# Patient Record
Sex: Female | Born: 1980 | Race: White | Hispanic: No | Smoking: Current every day smoker
Health system: Southern US, Community
[De-identification: ages and names within clinical notes are randomized; demographics above are authoritative.]

---

## 2007-09-10 ENCOUNTER — Emergency Department: Payer: Self-pay | Admitting: Emergency Medicine

## 2015-05-12 ENCOUNTER — Emergency Department
Admission: EM | Admit: 2015-05-12 | Discharge: 2015-05-12 | Disposition: A | Discharge status: Home / Self Care | Payer: Self-pay | Attending: Emergency Medicine | Admitting: Emergency Medicine

## 2015-05-12 ENCOUNTER — Encounter: Payer: Self-pay | Admitting: Emergency Medicine

## 2015-05-12 DIAGNOSIS — F1123 Opioid dependence with withdrawal: Secondary | ICD-10-CM | POA: Insufficient documentation

## 2015-05-12 DIAGNOSIS — F19230 Other psychoactive substance dependence with withdrawal, uncomplicated: Secondary | ICD-10-CM

## 2015-05-12 DIAGNOSIS — Z79899 Other long term (current) drug therapy: Secondary | ICD-10-CM | POA: Insufficient documentation

## 2015-05-12 DIAGNOSIS — F17210 Nicotine dependence, cigarettes, uncomplicated: Secondary | ICD-10-CM | POA: Insufficient documentation

## 2015-05-12 MED ORDER — CLONIDINE HCL 0.1 MG PO TABS: 0.1000 mg | ORAL_TABLET | Freq: Three times a day (TID) | ORAL | Status: DC | PRN

## 2015-05-12 MED ORDER — CLONIDINE HCL 0.1 MG PO TABS
0.1000 mg | ORAL_TABLET | Freq: Once | ORAL | Status: AC
  Administered 2015-05-12: 0.1 mg via ORAL
  Filled 2015-05-12: qty 1

## 2015-05-12 MED ORDER — ONDANSETRON 4 MG PO TBDP
4.0000 mg | ORAL_TABLET | Freq: Once | ORAL | Status: AC
  Administered 2015-05-12: 4 mg via ORAL
  Filled 2015-05-12: qty 1

## 2015-05-12 MED ORDER — ONDANSETRON HCL 4 MG PO TABS: 4.0000 mg | ORAL_TABLET | Freq: Three times a day (TID) | ORAL | Status: DC | PRN

## 2015-05-12 NOTE — ED Notes (Signed)
Patient c/o nausea, chills, eyes watering, sneezing, legs cramping, and unable to sleep. Patient states that symptoms started the day after her last dose of saboxone. Patient states that her symptoms are some better now but she needs help with the withdrawal symptoms.

## 2015-05-12 NOTE — ED Provider Notes (Signed)
[facility] Emergency Department Provider Note   ____________________________________________  Time seen:  I have reviewed the triage vital signs and the triage nursing note.  HISTORY  Chief Complaint Withdrawal   Historian Patient  HPI Breanna Henry is a 35 y.o. female who has been taking Suboxone chronically and stopped taking it about 7 days ago. She has had nausea, leg cramping, anxiety, trouble sleeping, and diarrhea. She does have a history of depression and is on medication for this now, but now denies any acute worsening depression or any suicidal ideation. She does not have a PCP as she is new to this area. She does not have a psychiatrist as she is new to the area.    History reviewed. No pertinent past medical history. depression and anxiety  There are no active problems to display for this patient.   History reviewed. No pertinent past surgical history.  Current Outpatient Rx  Name  Route  Sig  Dispense  Refill  . buprenorphine-naloxone (SUBOXONE) 8-2 MG SUBL SL tablet   Sublingual   Place 1 tablet under the tongue daily.         Marland Kitchen ibuprofen (ADVIL,MOTRIN) 200 MG tablet   Oral   Take 800 mg by mouth every 8 (eight) hours as needed for mild pain or moderate pain.         . cloNIDine (CATAPRES) 0.1 MG tablet   Oral   Take 1 tablet (0.1 mg total) by mouth 3 (three) times daily as needed (withdrawal symptoms including anxiety).   30 tablet   0   . ondansetron (ZOFRAN) 4 MG tablet   Oral   Take 1 tablet (4 mg total) by mouth every 8 (eight) hours as needed for nausea or vomiting.   30 tablet   0     Allergies Review of patient's allergies indicates no known allergies.  No family history on file.  Social History Social History  Substance Use Topics  . Smoking status: Current Every Day Smoker -- 1.00 packs/day    Types: Cigarettes  . Smokeless tobacco: Never Used  . Alcohol Use: No    Review of Systems  Constitutional:  Negative for fever. Eyes: Negative for visual changes. ENT: Negative for sore throat. Cardiovascular: Negative for chest pain. Respiratory: Negative for shortness of breath. Gastrointestinal: Positive for vomiting and diarrhea. Genitourinary: Negative for dysuria. Musculoskeletal: Negative for back pain. Skin: Negative for rash. Neurological: Occasional headache. 10 point Review of Systems otherwise negative ____________________________________________   PHYSICAL EXAM:  VITAL SIGNS: ED Triage Vitals  Enc Vitals Group     BP 05/12/15 1634 113/58 mmHg     Pulse Rate 05/12/15 1634 56     Resp 05/12/15 1634 18     Temp 05/12/15 1634 98.2 F (36.8 C)     Temp Source 05/12/15 1634 Oral     SpO2 05/12/15 1634 100 %     Weight 05/12/15 1634 130 lb (58.968 kg)     Height 05/12/15 1634  (1.676 m)     Head Cir --      Peak Flow --      Pain Score 05/12/15 1636 8     Pain Loc --      Pain Edu? --      Excl. in GC? --      Constitutional: Alert and oriented. Well appearing and in no distress. HEENT   Head: Normocephalic and atraumatic.      Eyes: Conjunctivae are normal. PERRL. Normal extraocular  movements.      Ears:         Nose: No congestion/rhinnorhea.   Mouth/Throat: Mucous membranes are moist.   Neck: No stridor. Cardiovascular/Chest: Normal rate, regular rhythm.  No murmurs, rubs, or gallops. Respiratory: Normal respiratory effort without tachypnea nor retractions. Breath sounds are clear and equal bilaterally. No wheezes/rales/rhonchi. Gastrointestinal: Soft. No distention, no guarding, no rebound. Nontender.    Genitourinary/rectal:Deferred Musculoskeletal: Nontender with normal range of motion in all extremities. No joint effusions.  No lower extremity tenderness.  No edema. Neurologic:  Normal speech and language. No gross or focal neurologic deficits are appreciated. Skin:  Skin is warm, dry and intact. No rash noted. Psychiatric: Mood and affect are  normal. Speech and behavior are normal. Patient exhibits appropriate insight and judgment.  ____________________________________________   EKG I, Governor Rooks, MD, the attending physician have personally viewed and interpreted all ECGs.  None ____________________________________________  LABS (pertinent positives/negatives)  None  ____________________________________________  RADIOLOGY All Xrays were viewed by me. Imaging interpreted by Radiologist.  None __________________________________________  PROCEDURES  Procedure(s) performed: None  Critical Care performed: None  ____________________________________________   ED COURSE / ASSESSMENT AND PLAN  Pertinent labs & imaging results that were available during my care of the patient were reviewed by me and considered in my medical decision making (see chart for details).   Patient is here with symptoms consistent with Suboxone withdrawal. She feels okay after discussion about treating with Zofran and clonidine and having her follow up at the [place] [facility] and also [facility].  Denies fevers or other illness. Chose to forgo any additional lab testing.    CONSULTATIONS:   None   Patient / Family / Caregiver informed of clinical course, medical decision-making process, and agree with plan.   I discussed return precautions, follow-up instructions, and discharged instructions with patient and/or family.   ___________________________________________   FINAL CLINICAL IMPRESSION(S) / ED DIAGNOSES   Final diagnoses:  Acute narcotic withdrawal, uncomplicated              Note: This dictation was prepared with Dragon dictation. Any transcriptional errors that result from this process are unintentional   Governor Rooks, MD 05/13/15 [phone]

## 2015-05-12 NOTE — ED Notes (Addendum)
Patient states "I have been on saboxone for the past 5 years and am tired of being dependent on the saboxone".  Patient stopped taking saboxone 6 days ago.  Last dose was 05/05/2015.  Patient states when stopped taking saboxone, had been taking 8 mg daily.  Patient had been trying to take self off of saboxone.  Denies SI/ HI.  C/O pain all over. Patient is AAOx3.

## 2015-05-12 NOTE — Discharge Instructions (Signed)
You were evaluated for symptoms of withdrawal from suboxone.  You are given medications for symptoms.  Due to follow-up with mental health provider at [facility], as well as primary care physician at [facility] clinic.  Return to the emergency department for any worsening condition including any chest pain or trouble breathing, confusion or altered mental status, seizure, worsening anxiety or depression or any thoughts of wanting to hurt yourself or others.   Opioid Withdrawal Opioids are a group of narcotic drugs. They include the street drug heroin. They also include pain medicines, such as morphine, hydrocodone, oxycodone, and fentanyl. Opioid withdrawal is a group of characteristic physical and mental signs and symptoms. It typically occurs if you have been using opioids daily for several weeks or longer and stop using or rapidly decrease use. Opioid withdrawal can also occur if you have used opioids daily for a long time and are given a medicine to block the effect.  SIGNS AND SYMPTOMS Opioid withdrawal includes three or more of the following symptoms:   Depressed, anxious, or irritable mood.  Nausea or vomiting.  Muscle aches or spasms.   Watery eyes.   Runny nose.  Dilated pupils, sweating, or hairs standing on end.  Diarrhea or intestinal cramping.  Yawning.   Fever.  Increased blood pressure.  Fast pulse.  Restlessness or trouble sleeping. These signs and symptoms occur within several hours of stopping or reducing short-acting opioids, such as heroin. They can occur within 3 days of stopping or reducing long-acting opioids, such as methadone. Withdrawal begins within minutes of receiving a drug that blocks the effects of opioids, such as naltrexone or naloxone. DIAGNOSIS  Opioid use disorder is diagnosed by your health care provider. You will be asked about your symptoms, drug and alcohol use, medical history, and use of medicines. A physical exam may be done. Lab tests may be  ordered. Your health care provider may have you see a mental health professional.  TREATMENT  The treatment for opioid withdrawal is usually provided by medical doctors with special training in substance use disorders (addiction specialists). The following medicines may be included in treatment:  Opioids given in place of the abused opioid. They turn on opioid receptors in the brain and lessen or prevent withdrawal symptoms. They are gradually decreased (opioid substitution and taper).  Non-opioids that can lessen certain opioid withdrawal symptoms. They may be used alone or with opioid substitution and taper. Successful long-term recovery usually requires medicine, counseling, and group support. HOME CARE INSTRUCTIONS   Take medicines only as directed by your health care provider.  Check with your health care provider before starting new medicines.  Keep all follow-up visits as directed by your health care provider. SEEK MEDICAL CARE IF:  You are not able to take your medicines as directed.  Your symptoms get worse.  You relapse. SEEK IMMEDIATE MEDICAL CARE IF:  You have serious thoughts about hurting yourself or others.  You have a seizure.  You lose consciousness.   This information is not intended to replace advice given to you by your health care provider. Make sure you discuss any questions you have with your health care provider.   Document Released: 03/20/2003 Document Revised: 04/07/2014 Document Reviewed: 03/30/2013 Elsevier Interactive Patient Education [employer]

## 2016-12-06 ENCOUNTER — Emergency Department: Payer: Self-pay

## 2016-12-06 DIAGNOSIS — J209 Acute bronchitis, unspecified: Secondary | ICD-10-CM | POA: Insufficient documentation

## 2016-12-06 DIAGNOSIS — F17210 Nicotine dependence, cigarettes, uncomplicated: Secondary | ICD-10-CM | POA: Insufficient documentation

## 2016-12-06 DIAGNOSIS — Z79899 Other long term (current) drug therapy: Secondary | ICD-10-CM | POA: Insufficient documentation

## 2016-12-06 LAB — CBC
HEMATOCRIT: 39.3 % (ref 35.0–47.0)
HEMOGLOBIN: 13.7 g/dL (ref 12.0–16.0)
MCH: 32.3 pg (ref 26.0–34.0)
MCHC: 35 g/dL (ref 32.0–36.0)
MCV: 92.4 fL (ref 80.0–100.0)
Platelets: 181 10*3/uL (ref 150–440)
RBC: 4.25 MIL/uL (ref 3.80–5.20)
RDW: 13.9 % (ref 11.5–14.5)
WBC: 9.5 10*3/uL (ref 3.6–11.0)

## 2016-12-06 LAB — BASIC METABOLIC PANEL
Anion gap: 8 (ref 5–15)
BUN: 10 mg/dL (ref 6–20)
CHLORIDE: 102 mmol/L (ref 101–111)
CO2: 25 mmol/L (ref 22–32)
Calcium: 9.2 mg/dL (ref 8.9–10.3)
Creatinine, Ser: 0.57 mg/dL (ref 0.44–1.00)
GFR calc Af Amer: >60 mL/min (ref >60)
GFR calc non Af Amer: >60 mL/min (ref >60)
GLUCOSE: 105 mg/dL — AB (ref 65–99)
POTASSIUM: 3.7 mmol/L (ref 3.5–5.1)
Sodium: 135 mmol/L (ref 135–145)

## 2016-12-06 LAB — TROPONIN I: Troponin I: <0.03 ng/mL (ref <0.03)

## 2016-12-06 NOTE — ED Triage Notes (Signed)
Patient reports chest pain across upper chest since yesterday.  Reports increased pain with cough.

## 2016-12-06 NOTE — ED Notes (Signed)
Patient transported to X-ray 

## 2016-12-07 ENCOUNTER — Emergency Department
Admission: EM | Admit: 2016-12-07 | Discharge: 2016-12-07 | Disposition: A | Discharge status: Home / Self Care | Payer: Self-pay | Attending: Emergency Medicine | Admitting: Emergency Medicine

## 2016-12-07 DIAGNOSIS — J209 Acute bronchitis, unspecified: Secondary | ICD-10-CM

## 2016-12-07 MED ORDER — AZITHROMYCIN 250 MG PO TABS: ORAL_TABLET | ORAL | 0 refills | Status: AC

## 2016-12-07 MED ORDER — BENZONATATE 100 MG PO CAPS
100.0000 mg | ORAL_CAPSULE | Freq: Once | ORAL | Status: AC
  Administered 2016-12-07: 100 mg via ORAL
  Filled 2016-12-07: qty 1

## 2016-12-07 MED ORDER — BENZONATATE 100 MG PO CAPS: 100.0000 mg | ORAL_CAPSULE | Freq: Three times a day (TID) | ORAL | 0 refills | Status: AC | PRN

## 2016-12-07 MED ORDER — AZITHROMYCIN 500 MG PO TABS
500.0000 mg | ORAL_TABLET | Freq: Once | ORAL | Status: AC
  Administered 2016-12-07: 500 mg via ORAL
  Filled 2016-12-07: qty 1

## 2016-12-07 MED ORDER — ALBUTEROL SULFATE HFA 108 (90 BASE) MCG/ACT IN AERS: 2.0000 | INHALATION_SPRAY | Freq: Four times a day (QID) | RESPIRATORY_TRACT | 0 refills | Status: DC | PRN

## 2016-12-07 NOTE — ED Provider Notes (Signed)
[facility] Emergency Department Provider Note    First MD Initiated Contact with Patient 12/07/16 [phone]     (approximate)  I have reviewed the triage vital signs and the nursing notes.   HISTORY  Chief Complaint Chest Pain and Cough    HPI Breanna Henry is a 36 y.o. female one-day history of cough congestion and chest discomfort with coughing. Patient denies any fever afebrile on presentation. Patient admits to a half a pack to two thirds of a pack daily cigarette use. Patient denies any known sick contact.   Past medical history None There are no active problems to display for this patient.   Past surgical history None  Prior to Admission medications   Medication Sig Start Date End Date Taking? Authorizing Provider  buprenorphine-naloxone (SUBOXONE) 8-2 MG SUBL SL tablet Place 1 tablet under the tongue daily.    [provider]  cloNIDine (CATAPRES) 0.1 MG tablet Take 1 tablet (0.1 mg total) by mouth 3 (three) times daily as needed (withdrawal symptoms including anxiety). 05/12/15 05/11/16  Governor RooksLord, Rebecca, MD  ibuprofen (ADVIL,MOTRIN) 200 MG tablet Take 800 mg by mouth every 8 (eight) hours as needed for mild pain or moderate pain.    [provider]  ondansetron (ZOFRAN) 4 MG tablet Take 1 tablet (4 mg total) by mouth every 8 (eight) hours as needed for nausea or vomiting. 05/12/15   Governor RooksLord, Rebecca, MD    Allergies No known drug allergies No family history on file.  Social History Social History  Substance Use Topics  . Smoking status: Current Every Day Smoker    Packs/day: 1.00    Types: Cigarettes  . Smokeless tobacco: Never Used  . Alcohol use No    Review of Systems Constitutional: No fever/chills Eyes: No visual changes. ENT: No sore throat. Cardiovascular: positive for chest discomfort  Respiratory: Denies shortness of breath.positive for cough Gastrointestinal: No abdominal pain.  No nausea, no vomiting.  No  diarrhea.  No constipation. Genitourinary: Negative for dysuria. Musculoskeletal: Negative for neck pain.  Negative for back pain. Integumentary: Negative for rash. Neurological: Negative for headaches, focal weakness or numbness.   ____________________________________________   PHYSICAL EXAM:  VITAL SIGNS: ED Triage Vitals  Enc Vitals Group     BP 12/06/16 2313 (!) 114/58     Pulse Rate 12/07/16 0339 76     Resp 12/06/16 2313 20     Temp 12/06/16 2313 98.9 F (37.2 C)     Temp Source 12/06/16 2313 Oral     SpO2 12/06/16 2313 98 %     Weight 12/06/16 2309 63.5 kg (140 lb)     Height 12/06/16 2309 1.676 m (5\' 6" )     Head Circumference --      Peak Flow --      Pain Score 12/06/16 2309 7     Pain Loc --      Pain Edu? --      Excl. in GC? --     Constitutional: Alert and oriented. Well appearing and in no acute distress. Eyes: Conjunctivae are normal.  Head: Atraumatic. Mouth/Throat: Mucous membranes are moist.  Oropharynx non-erythematous. Neck: No stridor.   Cardiovascular: Normal rate, regular rhythm. Good peripheral circulation. Grossly normal heart sounds. Respiratory: Normal respiratory effort.  No retractions. Lungs CTAB. Gastrointestinal: Soft and nontender. No distention.  Musculoskeletal: No lower extremity tenderness nor edema. No gross deformities of extremities. Neurologic:  Normal speech and language. No gross focal neurologic deficits are appreciated.  Skin:  Skin is warm, dry and intact. No rash noted. Psychiatric: Mood and affect are normal. Speech and behavior are normal.  ____________________________________________   LABS (all labs ordered are listed, but only abnormal results are displayed)  Labs Reviewed  BASIC METABOLIC PANEL - Abnormal; Notable for the following:       Result Value   Glucose, Bld 105 (*)    All other components within normal limits  CBC  TROPONIN I   ____________________________________________  EKG  ED ECG  REPORT I, [place] Rania Prothero, the attending physician, personally viewed and interpreted this ECG.   Date: 12/07/2016  EKG Time: 11:08 PM  Rate: 107  Rhythm: sinus tachycardia  Axis: normal  Intervals:normal  ST&T Change: none  ____________________________________________  RADIOLOGY I, [place] Idora Brosious, personally viewed and evaluated these images (plain radiographs) as part of my medical decision making, as well as reviewing the written report by the radiologist.  Dg Chest 2 View  Result Date: 12/07/2016 CLINICAL DATA:  Upper chest pain since yesterday. Increased pain with cough. Current smoker. EXAM: CHEST  2 VIEW COMPARISON:  None. FINDINGS: Mild hyperinflation. Peribronchial thickening suggesting chronic bronchitis. No airspace disease or consolidation in the lungs. No blunting of costophrenic angles. No pneumothorax. Mediastinal contours appear intact. Normal heart size and pulmonary vascularity. IMPRESSION: Chronic bronchitic changes in the lungs. No evidence of active pulmonary disease. Electronically Signed   By: Burman Nieves M.D.   On: 12/07/2016 00:01    ____________________  Procedures   ____________________________________________   INITIAL IMPRESSION / ASSESSMENT AND PLAN / ED COURSE  Pertinent labs & imaging results that were available during my care of the patient were reviewed by me and considered in my medical decision making (see chart for details).  36 year old female presenting with history of physical exam concerning for possible acute bronchitis. Chest x-ray revealed no evidence of pneumonia. Patient given azithromycin and Tessalon the emergency department will be prescribed the same for home as well as an albuterol inhaler      ____________________________________________  FINAL CLINICAL IMPRESSION(S) / ED DIAGNOSES  Final diagnoses:  Acute bronchitis, unspecified organism     MEDICATIONS GIVEN DURING THIS VISIT:  Medications - No data to  display   NEW OUTPATIENT MEDICATIONS STARTED DURING THIS VISIT:  New Prescriptions   No medications on file    Modified Medications   No medications on file    Discontinued Medications   No medications on file     Note:  This document was prepared using Dragon voice recognition software and may include unintentional dictation errors.    Darci Current, MD 12/07/16 [phone]

## 2016-12-07 NOTE — ED Notes (Signed)
ED Provider at bedside. 

## 2016-12-23 ENCOUNTER — Ambulatory Visit: Payer: Self-pay | Admitting: Pharmacy Technician

## 2017-01-06 ENCOUNTER — Ambulatory Visit: Payer: Self-pay | Admitting: Pharmacy Technician

## 2017-01-06 ENCOUNTER — Encounter (INDEPENDENT_AMBULATORY_CARE_PROVIDER_SITE_OTHER): Payer: Self-pay

## 2017-01-06 DIAGNOSIS — Z79899 Other long term (current) drug therapy: Secondary | ICD-10-CM

## 2017-01-06 NOTE — Progress Notes (Signed)
Completed [facility] application and contract.  Patient agreed to all terms of the [facility] contract.  Patient approved to receive medication assistance at [facility] through 2018, as long as eligibility criteria continues to be met.  Provided patient with community resource material based on her particular needs.    [facility]

## 2017-06-07 ENCOUNTER — Telehealth: Payer: Self-pay | Admitting: Pharmacy Technician

## 2017-06-07 NOTE — Telephone Encounter (Signed)
Patient failed to provide 2019 financial documentation.  No additional medication assistance will be provided by [facility] without the required proof of income documentation.  Patient notified by letter.  [employer] J. Devansh Riese Care Manager [facility] 

## 2017-11-18 ENCOUNTER — Emergency Department
Admission: EM | Admit: 2017-11-18 | Discharge: 2017-11-18 | Disposition: A | Discharge status: Home / Self Care | Payer: Self-pay | Attending: Emergency Medicine | Admitting: Emergency Medicine

## 2017-11-18 ENCOUNTER — Other Ambulatory Visit: Payer: Self-pay

## 2017-11-18 ENCOUNTER — Emergency Department: Payer: Self-pay

## 2017-11-18 DIAGNOSIS — R51 Headache: Secondary | ICD-10-CM | POA: Insufficient documentation

## 2017-11-18 DIAGNOSIS — Y929 Unspecified place or not applicable: Secondary | ICD-10-CM | POA: Insufficient documentation

## 2017-11-18 DIAGNOSIS — R6884 Jaw pain: Secondary | ICD-10-CM | POA: Insufficient documentation

## 2017-11-18 DIAGNOSIS — Y998 Other external cause status: Secondary | ICD-10-CM | POA: Insufficient documentation

## 2017-11-18 DIAGNOSIS — F17210 Nicotine dependence, cigarettes, uncomplicated: Secondary | ICD-10-CM | POA: Insufficient documentation

## 2017-11-18 DIAGNOSIS — M25511 Pain in right shoulder: Secondary | ICD-10-CM | POA: Insufficient documentation

## 2017-11-18 DIAGNOSIS — Y9389 Activity, other specified: Secondary | ICD-10-CM | POA: Insufficient documentation

## 2017-11-18 DIAGNOSIS — T07XXXA Unspecified multiple injuries, initial encounter: Secondary | ICD-10-CM | POA: Insufficient documentation

## 2017-11-18 DIAGNOSIS — S80212A Abrasion, left knee, initial encounter: Secondary | ICD-10-CM | POA: Insufficient documentation

## 2017-11-18 DIAGNOSIS — S0511XA Contusion of eyeball and orbital tissues, right eye, initial encounter: Secondary | ICD-10-CM | POA: Insufficient documentation

## 2017-11-18 DIAGNOSIS — H9202 Otalgia, left ear: Secondary | ICD-10-CM | POA: Insufficient documentation

## 2017-11-18 MED ORDER — ACETAMINOPHEN 500 MG PO TABS
1000.0000 mg | ORAL_TABLET | Freq: Once | ORAL | Status: AC
Start: 2017-11-18 — End: 2017-11-18
  Administered 2017-11-18: 1000 mg via ORAL
  Filled 2017-11-18: qty 2
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
## 2017-11-18 NOTE — Discharge Instructions (Addendum)
Continue to take Tylenol or Motrin for your pain.  Please apply an ice pack to your face for 10 to 15 minutes every 2 hours to decrease swelling and pain.  Return to the emergency department if you feel unsafe, if you develop severe pain or vomiting, for visual changes, or for any other symptoms concerning to you.

## 2017-11-18 NOTE — ED Notes (Addendum)
Visual acuity preformed with corrective lenses. L eye 20/40, R eye 20/50 and bilateral 20/40. Pt c/o everything being blurry in R eye.

## 2018-03-01 ENCOUNTER — Emergency Department
Admission: EM | Admit: 2018-03-01 | Discharge: 2018-03-01 | Disposition: A | Discharge status: Home / Self Care | Payer: Self-pay | Attending: Emergency Medicine | Admitting: Emergency Medicine

## 2018-03-01 ENCOUNTER — Encounter: Payer: Self-pay | Admitting: Emergency Medicine

## 2018-03-01 ENCOUNTER — Emergency Department: Payer: Self-pay

## 2018-03-01 ENCOUNTER — Other Ambulatory Visit: Payer: Self-pay

## 2018-03-01 DIAGNOSIS — R0602 Shortness of breath: Secondary | ICD-10-CM | POA: Insufficient documentation

## 2018-03-01 DIAGNOSIS — F1210 Cannabis abuse, uncomplicated: Secondary | ICD-10-CM | POA: Insufficient documentation

## 2018-03-01 DIAGNOSIS — R202 Paresthesia of skin: Secondary | ICD-10-CM | POA: Insufficient documentation

## 2018-03-01 DIAGNOSIS — F17210 Nicotine dependence, cigarettes, uncomplicated: Secondary | ICD-10-CM | POA: Insufficient documentation

## 2018-03-01 DIAGNOSIS — J9801 Acute bronchospasm: Secondary | ICD-10-CM | POA: Insufficient documentation

## 2018-03-01 LAB — CBC
HCT: 40.6 % (ref 36.0–46.0)
Hemoglobin: 13.6 g/dL (ref 12.0–15.0)
MCH: 31.8 pg (ref 26.0–34.0)
MCHC: 33.5 g/dL (ref 30.0–36.0)
MCV: 94.9 fL (ref 80.0–100.0)
Platelets: 217 10*3/uL (ref 150–400)
RBC: 4.28 MIL/uL (ref 3.87–5.11)
RDW: 12.7 % (ref 11.5–15.5)
WBC: 5.7 10*3/uL (ref 4.0–10.5)
nRBC: 0 % (ref 0.0–0.2)

## 2018-03-01 LAB — BASIC METABOLIC PANEL
Anion gap: 5 (ref 5–15)
BUN: 10 mg/dL (ref 6–20)
CO2: 26 mmol/L (ref 22–32)
Calcium: 8.8 mg/dL — ABNORMAL LOW (ref 8.9–10.3)
Chloride: 108 mmol/L (ref 98–111)
Creatinine, Ser: 0.58 mg/dL (ref 0.44–1.00)
GFR calc Af Amer: >60 mL/min (ref >60)
GLUCOSE: 117 mg/dL — AB (ref 70–99)
Potassium: 4.1 mmol/L (ref 3.5–5.1)
Sodium: 139 mmol/L (ref 135–145)

## 2018-03-01 LAB — TROPONIN I: Troponin I: <0.03 ng/mL (ref <0.03)

## 2018-03-01 MED ORDER — IPRATROPIUM-ALBUTEROL 0.5-2.5 (3) MG/3ML IN SOLN
3.0000 mL | Freq: Once | RESPIRATORY_TRACT | Status: AC
  Administered 2018-03-01: 3 mL via RESPIRATORY_TRACT
  Filled 2018-03-01: qty 3

## 2018-03-01 MED ORDER — ALBUTEROL SULFATE HFA 108 (90 BASE) MCG/ACT IN AERS: 2.0000 | INHALATION_SPRAY | Freq: Four times a day (QID) | RESPIRATORY_TRACT | 0 refills | Status: DC | PRN

## 2018-03-01 NOTE — ED Triage Notes (Signed)
Pt presents with sob and tingling in bilateral fingertips intermittently since last night. Pt denies hx of asthma or breathing difficulty. Pt alert & oriented, speaking in complete sentences. NAD noted.

## 2018-03-01 NOTE — ED Provider Notes (Signed)
[facility] Emergency Department Provider Note   ____________________________________________    I have reviewed the triage vital signs and the nursing notes.   HISTORY  Chief Complaint Shortness of Breath     HPI Breanna Henry is a 37 y.o. female who presents with complaints of shortness of breath yesterday.  Patient reports approximately 10 PM she felt mildly short of breath and her fingertips were tingling on both hands.  This morning she reports feeling better, her breathing is "almost normal".  She denies fevers or chills but is concerned because she had a pneumonia a year ago.  She does smoke cigarettes daily, half a pack.  She denies pleurisy, no recent travel.  No calf pain or swelling.   History reviewed. No pertinent past medical history.  There are no active problems to display for this patient.   History reviewed. No pertinent surgical history.  Prior to Admission medications   Medication Sig Start Date End Date Taking? Authorizing Provider  albuterol (PROVENTIL HFA;VENTOLIN HFA) 108 (90 Base) MCG/ACT inhaler Inhale 2 puffs into the lungs every 6 (six) hours as needed for wheezing or shortness of breath. 03/01/18   Jene Every, MD  buprenorphine-naloxone (SUBOXONE) 8-2 MG SUBL SL tablet Place 1 tablet under the tongue daily.    [provider]  cloNIDine (CATAPRES) 0.1 MG tablet Take 1 tablet (0.1 mg total) by mouth 3 (three) times daily as needed (withdrawal symptoms including anxiety). 05/12/15 05/11/16  Governor Rooks, MD  ibuprofen (ADVIL,MOTRIN) 200 MG tablet Take 800 mg by mouth every 8 (eight) hours as needed for mild pain or moderate pain.    [provider]  ondansetron (ZOFRAN) 4 MG tablet Take 1 tablet (4 mg total) by mouth every 8 (eight) hours as needed for nausea or vomiting. 05/12/15   Governor Rooks, MD     Allergies Patient has no known allergies.  History reviewed. No pertinent family history.  Social  History Social History   Tobacco Use  . Smoking status: Current Every Day Smoker    Packs/day: 0.50    Types: Cigarettes  . Smokeless tobacco: Never Used  Substance Use Topics  . Alcohol use: No  . Drug use: Yes    Types: Marijuana    Comment: occasional    Review of Systems  Constitutional: No fever/chills Eyes: No visual changes.  ENT: No throat swelling Cardiovascular: Denies chest pain. Respiratory: As above Gastrointestinal: No abdominal pain.  No nausea, no vomiting.   Genitourinary: Negative for dysuria. Musculoskeletal: Negative for back pain. Skin: Negative for rash. Neurological: Negative for headaches tingling as above   ____________________________________________   PHYSICAL EXAM:  VITAL SIGNS: ED Triage Vitals  Enc Vitals Group     BP --      Pulse Rate 03/01/18 1118 82     Resp 03/01/18 1118 18     Temp 03/01/18 1118 97.6 F (36.4 C)     Temp Source 03/01/18 1118 Oral     SpO2 03/01/18 1118 100 %     Weight 03/01/18 1121 61.2 kg (135 lb)     Height 03/01/18 1121 1.676 m (5\' 6" )     Head Circumference --      Peak Flow --      Pain Score 03/01/18 1119 7     Pain Loc --      Pain Edu? --      Excl. in GC? --     Constitutional: Alert and oriented.  Eyes: Conjunctivae  are normal.   Nose: No congestion/rhinnorhea. Mouth/Throat: Mucous membranes are moist.    Cardiovascular: Normal rate, regular rhythm. Grossly normal heart sounds.  Good peripheral circulation. Respiratory: Normal respiratory effort.  No retractions.  Scattered mild wheezes Gastrointestinal: Soft and nontender. No distention.   Musculoskeletal: No lower extremity tenderness nor edema.  Warm and well perfused Neurologic:  Normal speech and language. No gross focal neurologic deficits are appreciated.  Skin:  Skin is warm, dry and intact. No rash noted. Psychiatric: Mood and affect are normal. Speech and behavior are normal.  ____________________________________________     LABS (all labs ordered are listed, but only abnormal results are displayed)  Labs Reviewed  BASIC METABOLIC PANEL - Abnormal; Notable for the following components:      Result Value   Glucose, Bld 117 (*)    Calcium 8.8 (*)    All other components within normal limits  CBC  TROPONIN I  POC URINE PREG, ED   ____________________________________________  EKG  ED ECG REPORT I, Jene Everyobert Brexley Cutshaw, the attending physician, personally viewed and interpreted this ECG.  Date: 03/01/2018 * Rhythm: normal sinus rhythm QRS Axis: normal Intervals: normal ST/T Wave abnormalities: normal Narrative Interpretation: no evidence of acute ischemia  ____________________________________________  RADIOLOGY  Chest x-ray normal ____________________________________________   PROCEDURES  Procedure(s) performed: No  Procedures   Critical Care performed: No ____________________________________________   INITIAL IMPRESSION / ASSESSMENT AND PLAN / ED COURSE  Pertinent labs & imaging results that were available during my care of the patient were reviewed by me and considered in my medical decision making (see chart for details).  She well-appearing in no acute distress.  Oxygen saturations are normal here, respiratory rate is normal.  Lung exam significant for very mild scattered wheezing, will give DuoNeb.  Suspect bronchospasm related to cigarette use.  Panic attack is another possibility although less likely.  I discussed smoking cessation with the patient for greater than 3 minutes  She felt significant better after DuoNeb treatment.  Will discharge with albuterol inhaler, outpatient follow-up recommended.     ____________________________________________   FINAL CLINICAL IMPRESSION(S) / ED DIAGNOSES  Final diagnoses:  SOB (shortness of breath)  Bronchospasm        Note:  This document was prepared using Dragon voice recognition software and may include unintentional  dictation errors.    Jene EveryKinner, Marshay Slates, MD 03/01/18 1357

## 2019-02-08 ENCOUNTER — Encounter: Payer: Self-pay | Admitting: Emergency Medicine

## 2019-02-08 ENCOUNTER — Other Ambulatory Visit: Payer: Self-pay

## 2019-02-08 ENCOUNTER — Emergency Department
Admission: EM | Admit: 2019-02-08 | Discharge: 2019-02-08 | Disposition: A | Discharge status: Home / Self Care | Payer: Self-pay | Attending: Emergency Medicine | Admitting: Emergency Medicine

## 2019-02-08 DIAGNOSIS — F17210 Nicotine dependence, cigarettes, uncomplicated: Secondary | ICD-10-CM | POA: Insufficient documentation

## 2019-02-08 DIAGNOSIS — Z79899 Other long term (current) drug therapy: Secondary | ICD-10-CM | POA: Insufficient documentation

## 2019-02-08 DIAGNOSIS — K0889 Other specified disorders of teeth and supporting structures: Secondary | ICD-10-CM | POA: Insufficient documentation

## 2019-02-08 MED ORDER — AMOXICILLIN 875 MG PO TABS: 875.0000 mg | ORAL_TABLET | Freq: Two times a day (BID) | ORAL | 0 refills | Status: AC

## 2019-02-08 MED ORDER — KETOROLAC TROMETHAMINE 10 MG PO TABS: 10.0000 mg | ORAL_TABLET | Freq: Four times a day (QID) | ORAL | 0 refills | Status: AC | PRN

## 2019-02-08 MED ORDER — KETOROLAC TROMETHAMINE 30 MG/ML IJ SOLN
30.0000 mg | Freq: Once | INTRAMUSCULAR | Status: AC
  Administered 2019-02-08: 18:00:00 30 mg via INTRAMUSCULAR
  Filled 2019-02-08: qty 1

## 2019-02-08 NOTE — ED Provider Notes (Signed)
[facility] Emergency Department Provider Note  ____________________________________________  Time seen: Approximately 6:13 PM  I have reviewed the triage vital signs and the nursing notes.   HISTORY  Chief Complaint Dental Pain    HPI Breanna Henry is a 38 y.o. female presents to the emergency department with left-sided dental pain.  Patient has few teeth remaining of the left upper jaw and superior 11 is affected by dental caries.  Patient has noticed some swelling along the left upper face that is worsened over the past 2 days and patient states that she cannot chew on the affected side.  She denies fever and chills.  She does not currently have an appointment with a local dentist as she states that she does not have dental insurance.  She denies pain underneath the tongue or difficulty swallowing.  No other alleviating measures have been attempted.        History reviewed. No pertinent past medical history.  There are no active problems to display for this patient.   History reviewed. No pertinent surgical history.  Prior to Admission medications   Medication Sig Start Date End Date Taking? Authorizing Provider  albuterol (PROVENTIL HFA;VENTOLIN HFA) 108 (90 Base) MCG/ACT inhaler Inhale 2 puffs into the lungs every 6 (six) hours as needed for wheezing or shortness of breath. 03/01/18   Jene Every, MD  amoxicillin (AMOXIL) 875 MG tablet Take 1 tablet (875 mg total) by mouth 2 (two) times daily for 10 days. 02/08/19 02/18/19  Orvil Feil, PA-C  buprenorphine-naloxone (SUBOXONE) 8-2 MG SUBL SL tablet Place 1 tablet under the tongue daily.    [provider]  cloNIDine (CATAPRES) 0.1 MG tablet Take 1 tablet (0.1 mg total) by mouth 3 (three) times daily as needed (withdrawal symptoms including anxiety). 05/12/15 05/11/16  Governor Rooks, MD  ibuprofen (ADVIL,MOTRIN) 200 MG tablet Take 800 mg by mouth every 8 (eight) hours as needed for mild pain or  moderate pain.    [provider]  ondansetron (ZOFRAN) 4 MG tablet Take 1 tablet (4 mg total) by mouth every 8 (eight) hours as needed for nausea or vomiting. 05/12/15   Governor Rooks, MD    Allergies Patient has no known allergies.  No family history on file.  Social History Social History   Tobacco Use  . Smoking status: Current Every Day Smoker    Packs/day: 0.50    Types: Cigarettes  . Smokeless tobacco: Never Used  Substance Use Topics  . Alcohol use: No  . Drug use: Yes    Types: Marijuana    Comment: occasional     Review of Systems  Constitutional: No fever/chills Eyes: No visual changes. No discharge ENT: Patient has dental pain.  Cardiovascular: no chest pain. Respiratory: no cough. No SOB. Gastrointestinal: No abdominal pain.  No nausea, no vomiting.  No diarrhea.  No constipation. Genitourinary: Negative for dysuria. No hematuria Musculoskeletal: Negative for musculoskeletal pain. Skin: Negative for rash, abrasions, lacerations, ecchymosis. Neurological: Negative for headaches, focal weakness or numbness.   ____________________________________________   PHYSICAL EXAM:  VITAL SIGNS: ED Triage Vitals  Enc Vitals Group     BP 02/08/19 1707 (!) 105/58     Pulse Rate 02/08/19 1707 92     Resp 02/08/19 1707 16     Temp 02/08/19 1707 98.5 F (36.9 C)     Temp Source 02/08/19 1707 Oral     SpO2 02/08/19 1707 99 %     Weight 02/08/19 1701 134 lb 14.7  oz (61.2 kg)     Height --      Head Circumference --      Peak Flow --      Pain Score 02/08/19 1701 9     Pain Loc --      Pain Edu? --      Excl. in [place]? --      Constitutional: Alert and oriented. Well appearing and in no acute distress. Eyes: Conjunctivae are normal. PERRL. EOMI. Head: Atraumatic. ENT:      Ears:       Nose: No congestion/rhinnorhea.      Mouth/Throat: Mucous membranes are moist.  Patient has left upper jaw swelling.  No pain to palpation underneath the tongue.   Superior 11 is affected by dental caries. Neck: No stridor.  No cervical spine tenderness to palpation. Cardiovascular: Normal rate, regular rhythm. Normal S1 and S2.  Good peripheral circulation. Respiratory: Normal respiratory effort without tachypnea or retractions. Lungs CTAB. Good air entry to the bases with no decreased or absent breath sounds.  Skin:  Skin is warm, dry and intact. No rash noted. Psychiatric: Mood and affect are normal. Speech and behavior are normal. Patient exhibits appropriate insight and judgement.   ____________________________________________   LABS (all labs ordered are listed, but only abnormal results are displayed)  Labs Reviewed - No data to display ____________________________________________  EKG   ____________________________________________  RADIOLOGY   No results found.  ____________________________________________    PROCEDURES  Procedure(s) performed:    Procedures    Medications  ketorolac (TORADOL) 30 MG/ML injection 30 mg (has no administration in time range)     ____________________________________________   INITIAL IMPRESSION / ASSESSMENT AND PLAN / ED COURSE  Pertinent labs & imaging results that were available during my care of the patient were reviewed by me and considered in my medical decision making (see chart for details).  Review of the [place] CSRS was performed in accordance of the [place] prior to dispensing any controlled drugs.         Assessment and plan Dental pain 38 year old female presents to the emergency department with dental pain of superior 11 and swelling of the left upper jaw.  Vital signs were reassuring at triage.  Patient had some mild left upper jaw swelling without gingival induration or fluctuance that would suggest a drainable fluid collection.  Patient had no pain underneath the tongue.  Patient was started on amoxicillin and advised to follow-up with local dentist.  She was given an  injection of Toradol in the emergency department and discharged with oral Toradol.    ____________________________________________  FINAL CLINICAL IMPRESSION(S) / ED DIAGNOSES  Final diagnoses:  Pain, dental      NEW MEDICATIONS STARTED DURING THIS VISIT:  ED Discharge Orders         Ordered    amoxicillin (AMOXIL) 875 MG tablet  2 times daily     02/08/19 1810              This chart was dictated using voice recognition software/Dragon. Despite best efforts to proofread, errors can occur which can change the meaning. Any change was purely unintentional.    Lannie Fields, PA-C 02/08/19 1818    Harvest Dark, MD 02/08/19 2119

## 2019-02-08 NOTE — Discharge Instructions (Signed)
OPTIONS FOR DENTAL FOLLOW UP CARE ° °[place] Department of Health and Human Services - Local Safety Net Dental Clinics °http://www.ncdhhs.gov/dph/oralhealth/services/safetynetclinics.htm °  °[facility] (336-562-3123) ° °[facility] [place] (919-933-9087) ° °[facility] ([phone] ext [phone]) ° °[facility] ([phone]) ° °[facility] ([phone]) °This clinic caters to the indigent population and is on a lottery system. °Location: °[facility], [address], [place] °Clinic Hours: °Wednesdays from 6pm - 9pm, patients seen by a lottery system. °For dates, call or go to [email or web address] °Services: °Cleanings, fillings and simple extractions. °Payment Options: °DENTAL WORK IS FREE OF CHARGE. Bring proof of income or support. °Best way to get seen: °Arrive at 5:15 pm - this is a lottery, NOT first come/first serve, so arriving earlier will not increase your chances of being seen. °  °  °[facility] °[phone] °Select option 1 for emergencies °  °Location: °[facility], [address], [place] °Clinic Hours: °No walk-ins accepted - call the day before to schedule an appointment. °Check in times are 9:30 am and 1:30 pm. °Services: °Simple extractions, temporary fillings, pulpectomy/pulp debridement, uncomplicated abscess drainage. °Payment Options: °PAYMENT IS DUE AT THE TIME OF SERVICE.  Fee is usually $100-200, additional surgical procedures (e.g. abscess drainage) may be extra. °Cash, checks, Visa/MasterCard accepted.  Can file Medicaid if patient is covered for dental - patient should call case worker to check. °No discount for [facility] patients. °Best way to get seen: °MUST call the day before and get onto the schedule. Can usually be seen the next 1-2 days. No walk-ins accepted. °  °  °[facility] °[phone] °   °Location: °[facility], [address], [place] °Clinic Hours: °M, W, Th, F 8am or 1:30pm, Tues 9a or 1:30 - first come/first served. °Services: °Simple extractions, temporary fillings, uncomplicated abscess drainage.  You do not need to be an [place] resident. °Payment Options: °PAYMENT IS DUE AT THE TIME OF SERVICE. °Dental insurance, otherwise sliding scale - bring proof of income or support. °Depending on income and treatment needed, cost is usually $50-200. °Best way to get seen: °Arrive early as it is first come/first served. °  °  °[facility] °[phone] °  °Location: °[address] °Clinic Hours: °Mon-Thu 8a-5p °Services: °Most basic dental services including extractions and fillings. °Payment Options: °PAYMENT IS DUE AT THE TIME OF SERVICE. °Sliding scale, up to 50% off - bring proof if income or support. °Medicaid with dental option accepted. °Best way to get seen: °Call to schedule an appointment, can usually be seen within 2 weeks OR they will try to see walk-ins - show up at 8a or 2p (you may have to wait). °  °  °[facility] °[phone] °[place] RESIDENTS ONLY °  °Location: °[facility], [address], [place], [place] [address] °Clinic Hours: By appointment only. °Monday - Thursday 8am-5pm, Friday 8am-12pm °Services: Cleanings, fillings, extractions. °Payment Options: °PAYMENT IS DUE AT THE TIME OF SERVICE. °Cash, Visa or MasterCard. Sliding scale - $30 minimum per service. °Best way to get seen: °Come in to office, complete packet and make an appointment - need proof of income °or support monies for each household member and proof of [place] residence. °Usually takes about a month to get in. °  °  °[facility] °[phone] °  °Location: °[address]   [place] °Clinic Hours: Walk-in Urgent Care Dental Services are offered Monday-Friday  mornings only. °The numbers of emergencies accepted daily is limited to the number of °providers available. °Maximum 15 - Mondays, Wednesdays & Thursdays °Maximum 10 - Tuesdays & Fridays °Services: °You do not need to be a [place] resident to be seen for a dental emergency. °Emergencies are defined as pain, swelling, abnormal bleeding, or dental trauma. Walkins will receive x-rays if needed. °NOTE: Dental cleaning is not an emergency. °Payment Options: °PAYMENT IS DUE AT THE TIME OF SERVICE. °Minimum co-pay is $40.00 for uninsured patients. °Minimum co-pay is $3.00 for Medicaid with dental coverage. °Dental Insurance is accepted and must be presented at time of visit. °Medicare does not cover dental. °Forms of payment: Cash, credit card, checks. °Best way to get seen: °If not previously registered with the clinic, walk-in dental registration begins at 7:15 am and is on a first come/first serve basis. °If previously registered with the clinic, call to make an appointment. °  °  °[facility] °[phone] °[place] RESIDENTS ONLY °  °Location: °[address], [place], [place] °Clinic Hours: °Mon-Thu 10a-2p °Services: Extractions only! °Payment Options: °FREE (donations accepted) - bring proof of income or support °Best way to get seen: °Call and schedule an appointment OR come at 8am on the 1st Monday of every month (except for holidays) when it is first come/first served. °  °  °[facility] °[phone] °  °Location: °[address], [place] °Clinic Hours: °Friday mornings °Services, Payment Options, Best way to get seen: °Call for info °

## 2019-02-08 NOTE — ED Triage Notes (Signed)
C/O left upper jaw pain and swelling.

## 2019-02-08 NOTE — ED Notes (Signed)
Pt reports needs a lot of dental work and is planning to get some dentures but has pain and swelling around a tooth on the left upper jaw. Pt reports pain for the last 4 days but the last 24 hours has been worse. Pt with swelling noted to the left side of her face. Denies breathing difficulties.

## 2019-05-05 ENCOUNTER — Ambulatory Visit: Payer: Self-pay | Attending: Internal Medicine

## 2019-05-05 DIAGNOSIS — Z20822 Contact with and (suspected) exposure to covid-19: Secondary | ICD-10-CM | POA: Insufficient documentation

## 2019-05-06 LAB — NOVEL CORONAVIRUS, NAA: SARS-CoV-2, NAA: NOT DETECTED
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
## 2020-01-24 IMAGING — CR DG CHEST 2V
1 series · 2 of 2 positions shown · non-contrast
Comparison: 12/06/2016

CLINICAL DATA: Chest tightness which shortness-of-breath since last
night.

EXAM:
CHEST - 2 VIEW

[Series 1: dg chest 2 view · 0.14mm/px · 2 of 2 slices shown]
[im 1/2]
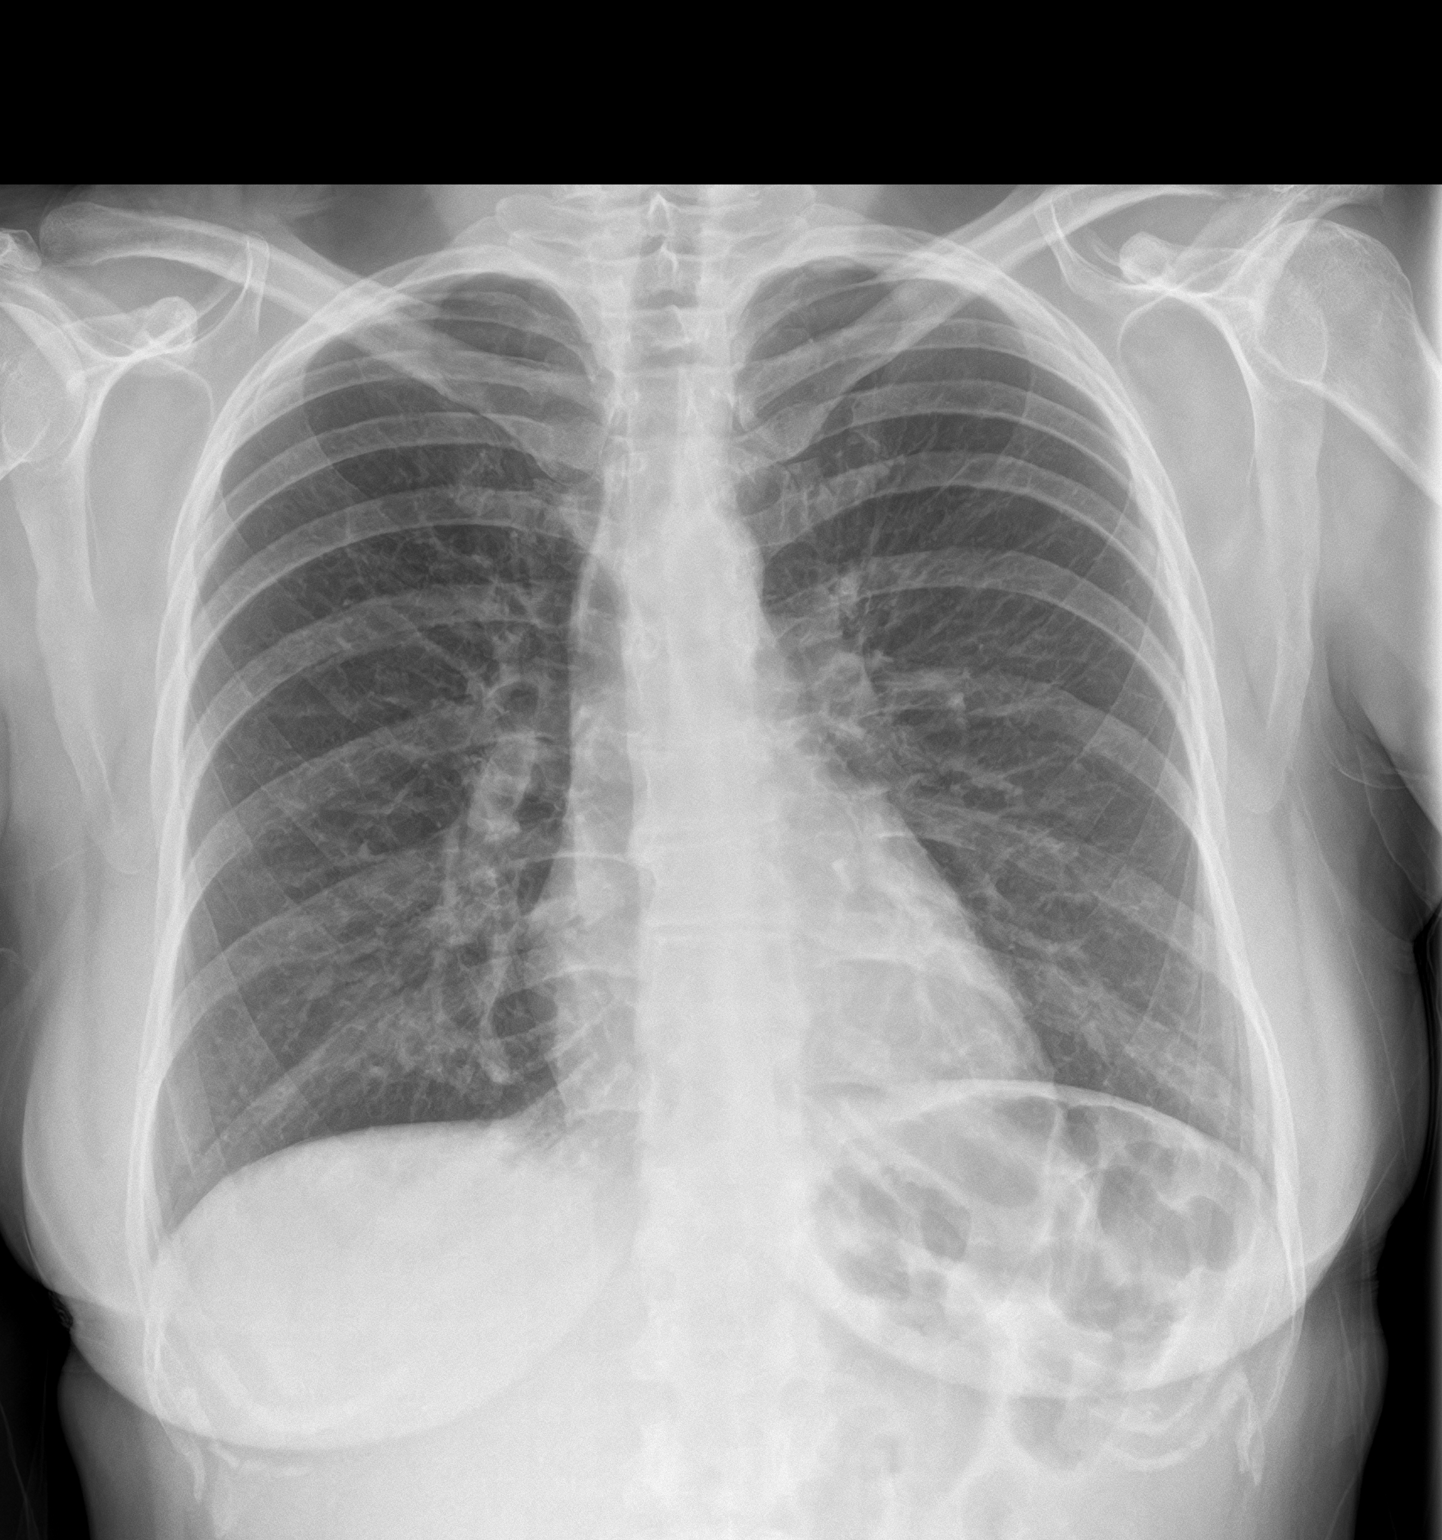
[im 2/2]
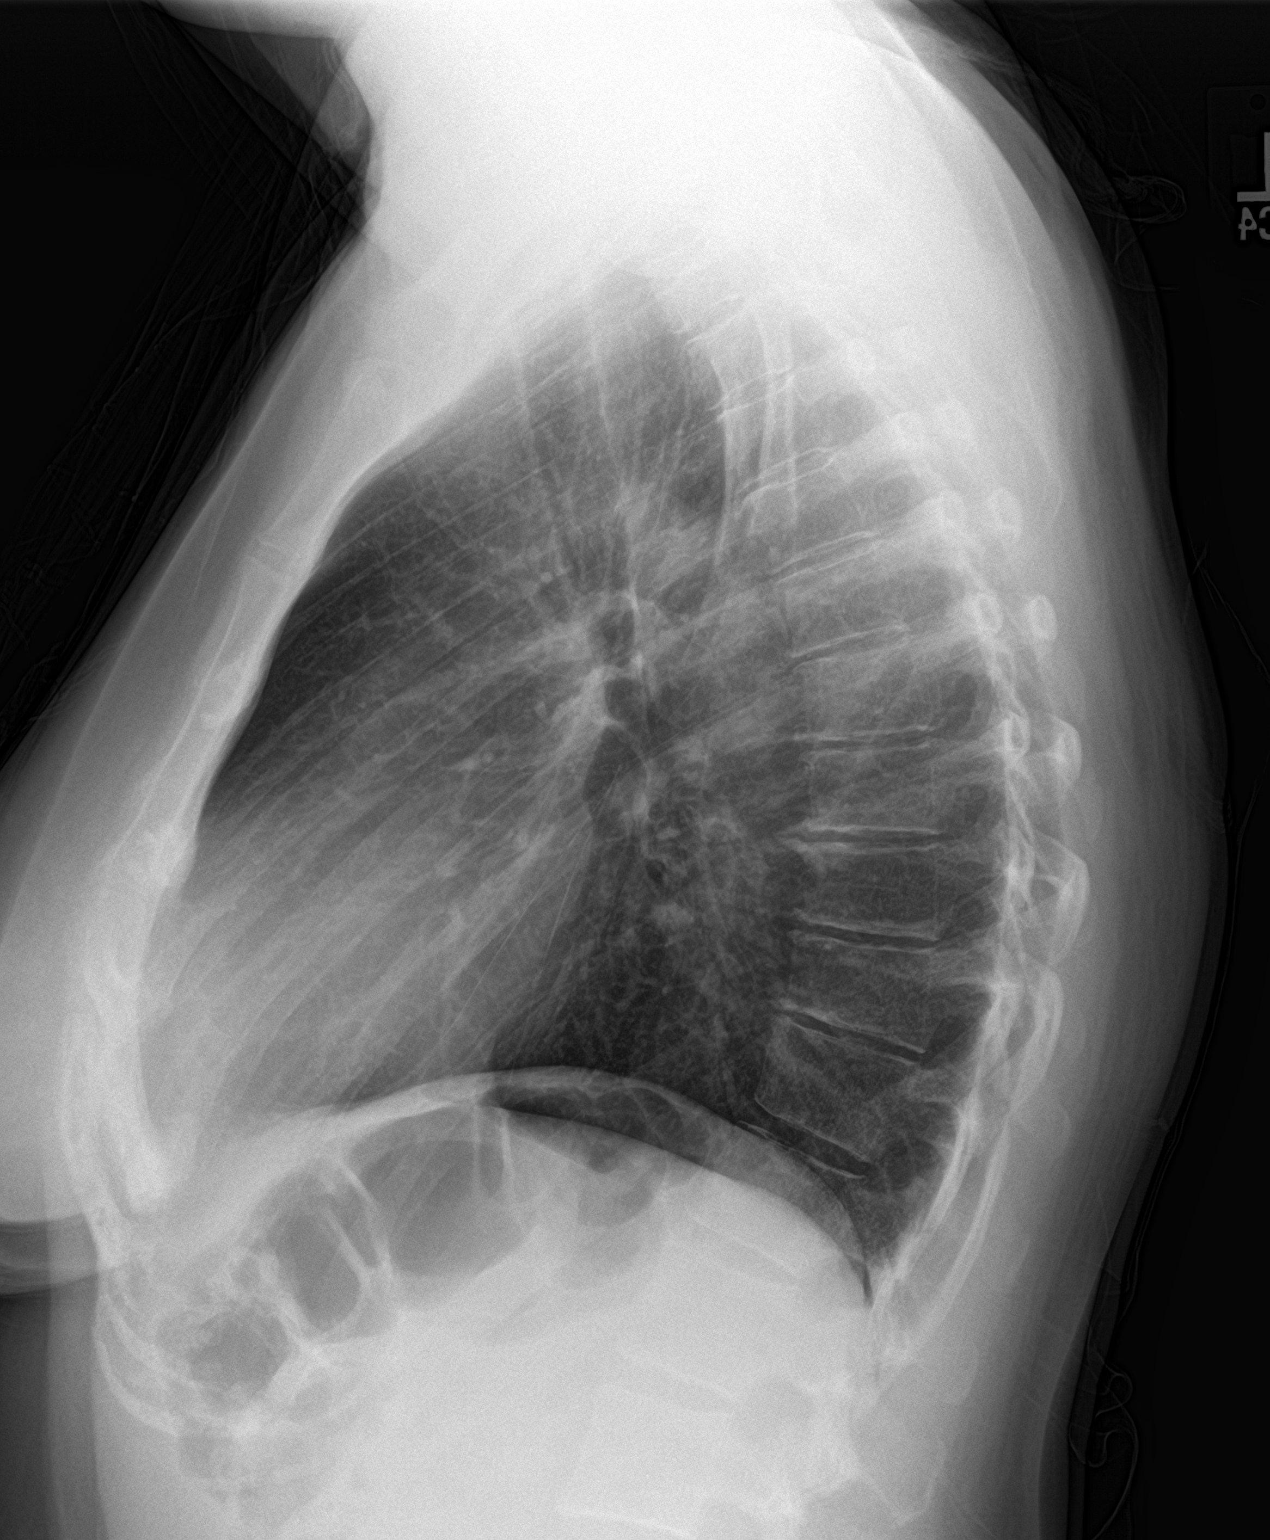

[2 of 2 positions shown; findings below may reference images not displayed]

FINDINGS: Lungs are adequately inflated without focal airspace consolidation
or effusion. Cardiomediastinal silhouette and remainder of the exam
is unchanged.
IMPRESSION: No active cardiopulmonary disease.

## 2021-03-01 ENCOUNTER — Encounter: Payer: Self-pay | Admitting: Emergency Medicine

## 2021-03-01 DIAGNOSIS — F17210 Nicotine dependence, cigarettes, uncomplicated: Secondary | ICD-10-CM | POA: Insufficient documentation

## 2021-03-01 DIAGNOSIS — F1410 Cocaine abuse, uncomplicated: Secondary | ICD-10-CM | POA: Diagnosis present

## 2021-03-01 DIAGNOSIS — Z79899 Other long term (current) drug therapy: Secondary | ICD-10-CM | POA: Insufficient documentation

## 2021-03-01 LAB — CBC
HCT: 40.5 % (ref 36.0–46.0)
Hemoglobin: 13.5 g/dL (ref 12.0–15.0)
MCH: 31.5 pg (ref 26.0–34.0)
MCHC: 33.3 g/dL (ref 30.0–36.0)
MCV: 94.4 fL (ref 80.0–100.0)
Platelets: 221 10*3/uL (ref 150–400)
RBC: 4.29 MIL/uL (ref 3.87–5.11)
RDW: 12.6 % (ref 11.5–15.5)
WBC: 6.5 10*3/uL (ref 4.0–10.5)
nRBC: 0 % (ref 0.0–0.2)

## 2021-03-01 LAB — COMPREHENSIVE METABOLIC PANEL
ALT: 11 U/L (ref 0–44)
AST: 17 U/L (ref 15–41)
Albumin: 3.7 g/dL (ref 3.5–5.0)
Alkaline Phosphatase: 60 U/L (ref 38–126)
Anion gap: 4 — ABNORMAL LOW (ref 5–15)
BUN: 9 mg/dL (ref 6–20)
CO2: 29 mmol/L (ref 22–32)
Calcium: 8.2 mg/dL — ABNORMAL LOW (ref 8.9–10.3)
Chloride: 104 mmol/L (ref 98–111)
Creatinine, Ser: 0.53 mg/dL (ref 0.44–1.00)
GFR, Estimated: >60 mL/min (ref >60)
Glucose, Bld: 111 mg/dL — ABNORMAL HIGH (ref 70–99)
Potassium: 3.6 mmol/L (ref 3.5–5.1)
Sodium: 137 mmol/L (ref 135–145)
Total Bilirubin: 0.5 mg/dL (ref 0.3–1.2)
Total Protein: 5.9 g/dL — ABNORMAL LOW (ref 6.5–8.1)

## 2021-03-01 LAB — URINE DRUG SCREEN, QUALITATIVE (ARMC ONLY)
Amphetamines, Ur Screen: NOT DETECTED
Barbiturates, Ur Screen: NOT DETECTED
Benzodiazepine, Ur Scrn: NOT DETECTED
Cannabinoid 50 Ng, Ur ~~LOC~~: POSITIVE — AB
Cocaine Metabolite,Ur ~~LOC~~: POSITIVE — AB
MDMA (Ecstasy)Ur Screen: NOT DETECTED
Methadone Scn, Ur: NOT DETECTED
Opiate, Ur Screen: NOT DETECTED
Phencyclidine (PCP) Ur S: NOT DETECTED
Tricyclic, Ur Screen: NOT DETECTED

## 2021-03-01 LAB — ETHANOL: Alcohol, Ethyl (B): <10 mg/dL (ref <10)

## 2021-03-01 NOTE — ED Triage Notes (Signed)
Pt here for help to detox from cocaine. Last used yesterday. Denies SI/HI.

## 2021-03-02 ENCOUNTER — Emergency Department
Admission: EM | Admit: 2021-03-02 | Discharge: 2021-03-02 | Disposition: A | Discharge status: Home / Self Care | Payer: No Typology Code available for payment source | Attending: Emergency Medicine | Admitting: Emergency Medicine

## 2021-03-02 DIAGNOSIS — F1910 Other psychoactive substance abuse, uncomplicated: Secondary | ICD-10-CM

## 2021-03-02 NOTE — ED Provider Notes (Signed)
[facility] Emergency Department Provider Note   ____________________________________________   I have reviewed the triage vital signs and the nursing notes.   HISTORY  Chief Complaint Drug Problem   History limited by: Not Limited   HPI Breanna Henry is a 40 y.o. female who presents to the emergency department today because of desire to detox from drug use. The patient has been using cocaine and has had issues with opioids in the past. The patient says she last did a residential detox roughly 15 years ago. Had been on suboxone but stopped roughly a year ago. Denies any medical complaints.    Records reviewed. Per medical record review patient has a history of opioid use.   History reviewed. No pertinent past medical history.  There are no problems to display for this patient.   History reviewed. No pertinent surgical history.  Prior to Admission medications   Not on File    Allergies Patient has no known allergies.  No family history on file.  Social History Social History   Tobacco Use   Smoking status: Every Day    Packs/day: 0.50    Types: Cigarettes   Smokeless tobacco: Never  Substance Use Topics   Alcohol use: No   Drug use: Yes    Types: Marijuana, Cocaine    Comment: occasional    Review of Systems Constitutional: No fever/chills Eyes: No visual changes. ENT: No sore throat. Cardiovascular: Denies chest pain. Respiratory: Denies shortness of breath. Gastrointestinal: No abdominal pain.  No nausea, no vomiting.  No diarrhea.   Genitourinary: Negative for dysuria. Musculoskeletal: Negative for back pain. Skin: Negative for rash. Neurological: Negative for headaches, focal weakness or numbness.  ____________________________________________   PHYSICAL EXAM:  VITAL SIGNS: ED Triage Vitals  Enc Vitals Group     BP 03/01/21 1802 130/65     Pulse Rate 03/01/21 1802 98     Resp 03/01/21 1802 16     Temp 03/01/21 1802  98.7 F (37.1 C)     Temp Source 03/01/21 1802 Oral     SpO2 03/01/21 1802 97 %     Weight 03/01/21 1803 130 lb (59 kg)     Height 03/01/21 1803 5\' 6"  (1.676 m)     Head Circumference --      Peak Flow --      Pain Score 03/01/21 1803 0   Constitutional: Alert and oriented.  Eyes: Conjunctivae are normal.  ENT      Head: Normocephalic and atraumatic.      Nose: No congestion/rhinnorhea.      Mouth/Throat: Mucous membranes are moist.      Neck: No stridor. Hematological/Lymphatic/Immunilogical: No cervical lymphadenopathy. Cardiovascular: Normal rate, regular rhythm.  No murmurs, rubs, or gallops.  Respiratory: Normal respiratory effort without tachypnea nor retractions. Breath sounds are clear and equal bilaterally. No wheezes/rales/rhonchi. Gastrointestinal: Soft and non tender. No rebound. No guarding.  Genitourinary: Deferred Musculoskeletal: Normal range of motion in all extremities. No lower extremity edema. Neurologic:  Normal speech and language. No gross focal neurologic deficits are appreciated.  Skin:  Skin is warm, dry and intact. No rash noted. Psychiatric: Mood and affect are normal. Speech and behavior are normal. Patient exhibits appropriate insight and judgment.  ____________________________________________    LABS (pertinent positives/negatives)  UDS positive for cocaine, cannabinoid CMP wnl except glu 111, ca 8.2, t pro 5.9 Ethanol <10 CBC wbc 6.5, hgb 13.5, plt 221  ____________________________________________   EKG  None  ____________________________________________  RADIOLOGY  None  ____________________________________________   PROCEDURES  Procedures  ____________________________________________   INITIAL IMPRESSION / ASSESSMENT AND PLAN / ED COURSE  Pertinent labs & imaging results that were available during my care of the patient were reviewed by me and considered in my medical decision making (see chart for details).    Patient presented to the ER today with help for detox. Will have TTS evaluate. She denies any medical complaints at this time.   ____________________________________________   FINAL CLINICAL IMPRESSION(S) / ED DIAGNOSES  Final diagnoses:  Drug abuse (HCC)     Note: This dictation was prepared with Dragon dictation. Any transcriptional errors that result from this process are unintentional     Phineas Semen, MD 03/02/21 [phone]

## 2021-03-02 NOTE — Discharge Instructions (Signed)
Please use the resources provided today to help in your detox. Please seek medical attention for any high fevers, chest pain, shortness of breath, change in behavior, persistent vomiting, bloody stool or any other new or concerning symptoms.

## 2022-11-26 ENCOUNTER — Emergency Department: Payer: MEDICAID

## 2022-11-26 ENCOUNTER — Inpatient Hospital Stay
Admission: EM | Admit: 2022-11-26 | Discharge: 2022-11-30 | DRG: 603 | Disposition: A | Discharge status: Home / Self Care | Payer: MEDICAID | Attending: Internal Medicine | Admitting: Internal Medicine

## 2022-11-26 ENCOUNTER — Other Ambulatory Visit: Payer: Self-pay

## 2022-11-26 ENCOUNTER — Encounter: Payer: Self-pay | Admitting: Medical Oncology

## 2022-11-26 DIAGNOSIS — L02419 Cutaneous abscess of limb, unspecified: Secondary | ICD-10-CM | POA: Diagnosis not present

## 2022-11-26 DIAGNOSIS — Z7151 Drug abuse counseling and surveillance of drug abuser: Secondary | ICD-10-CM | POA: Diagnosis not present

## 2022-11-26 DIAGNOSIS — E663 Overweight: Secondary | ICD-10-CM | POA: Diagnosis present

## 2022-11-26 DIAGNOSIS — E876 Hypokalemia: Secondary | ICD-10-CM | POA: Diagnosis present

## 2022-11-26 DIAGNOSIS — R5381 Other malaise: Secondary | ICD-10-CM | POA: Diagnosis present

## 2022-11-26 DIAGNOSIS — L02414 Cutaneous abscess of left upper limb: Secondary | ICD-10-CM | POA: Diagnosis present

## 2022-11-26 DIAGNOSIS — F1210 Cannabis abuse, uncomplicated: Secondary | ICD-10-CM | POA: Diagnosis present

## 2022-11-26 DIAGNOSIS — L03114 Cellulitis of left upper limb: Secondary | ICD-10-CM | POA: Diagnosis not present

## 2022-11-26 DIAGNOSIS — F17210 Nicotine dependence, cigarettes, uncomplicated: Secondary | ICD-10-CM | POA: Diagnosis present

## 2022-11-26 DIAGNOSIS — F1910 Other psychoactive substance abuse, uncomplicated: Secondary | ICD-10-CM | POA: Diagnosis not present

## 2022-11-26 DIAGNOSIS — Z716 Tobacco abuse counseling: Secondary | ICD-10-CM | POA: Diagnosis not present

## 2022-11-26 DIAGNOSIS — Z6829 Body mass index (BMI) 29.0-29.9, adult: Secondary | ICD-10-CM

## 2022-11-26 DIAGNOSIS — L03119 Cellulitis of unspecified part of limb: Secondary | ICD-10-CM | POA: Diagnosis not present

## 2022-11-26 DIAGNOSIS — F1510 Other stimulant abuse, uncomplicated: Secondary | ICD-10-CM | POA: Diagnosis present

## 2022-11-26 LAB — BASIC METABOLIC PANEL
Anion gap: 5 (ref 5–15)
BUN: 8 mg/dL (ref 6–20)
CO2: 25 mmol/L (ref 22–32)
Calcium: 8.5 mg/dL — ABNORMAL LOW (ref 8.9–10.3)
Chloride: 104 mmol/L (ref 98–111)
Creatinine, Ser: 0.69 mg/dL (ref 0.44–1.00)
GFR, Estimated: >60 mL/min (ref >60)
Glucose, Bld: 111 mg/dL — ABNORMAL HIGH (ref 70–99)
Potassium: 3.1 mmol/L — ABNORMAL LOW (ref 3.5–5.1)
Sodium: 134 mmol/L — ABNORMAL LOW (ref 135–145)

## 2022-11-26 LAB — CBC
HCT: 39.6 % (ref 36.0–46.0)
Hemoglobin: 13.2 g/dL (ref 12.0–15.0)
MCH: 29.7 pg (ref 26.0–34.0)
MCHC: 33.3 g/dL (ref 30.0–36.0)
MCV: 89.2 fL (ref 80.0–100.0)
Platelets: 224 10*3/uL (ref 150–400)
RBC: 4.44 MIL/uL (ref 3.87–5.11)
RDW: 13.3 % (ref 11.5–15.5)
WBC: 9.1 10*3/uL (ref 4.0–10.5)
nRBC: 0 % (ref 0.0–0.2)

## 2022-11-26 LAB — MAGNESIUM: Magnesium: 2.1 mg/dL (ref 1.7–2.4)

## 2022-11-26 LAB — URINE DRUG SCREEN, QUALITATIVE (ARMC ONLY)
Amphetamines, Ur Screen: POSITIVE — AB
Barbiturates, Ur Screen: NOT DETECTED
Benzodiazepine, Ur Scrn: NOT DETECTED
Cannabinoid 50 Ng, Ur ~~LOC~~: POSITIVE — AB
Cocaine Metabolite,Ur ~~LOC~~: NOT DETECTED
MDMA (Ecstasy)Ur Screen: NOT DETECTED
Methadone Scn, Ur: NOT DETECTED
Opiate, Ur Screen: NOT DETECTED
Phencyclidine (PCP) Ur S: NOT DETECTED
Tricyclic, Ur Screen: NOT DETECTED

## 2022-11-26 LAB — HIV ANTIBODY (ROUTINE TESTING W REFLEX): HIV Screen 4th Generation wRfx: NONREACTIVE

## 2022-11-26 MED ORDER — DIPHENHYDRAMINE HCL 25 MG PO CAPS
25.0000 mg | ORAL_CAPSULE | Freq: Four times a day (QID) | ORAL | Status: DC | PRN
  Administered 2022-11-26: 25 mg via ORAL
  Filled 2022-11-26: qty 1

## 2022-11-26 MED ORDER — NICOTINE 14 MG/24HR TD PT24
14.0000 mg | MEDICATED_PATCH | Freq: Every day | TRANSDERMAL | Status: DC
  Administered 2022-11-26 – 2022-11-30 (×5): 14 mg via TRANSDERMAL
  Filled 2022-11-26 (×5): qty 1

## 2022-11-26 MED ORDER — VANCOMYCIN HCL IN DEXTROSE 1-5 GM/200ML-% IV SOLN
1000.0000 mg | Freq: Once | INTRAVENOUS | Status: AC
  Administered 2022-11-26: 1000 mg via INTRAVENOUS
  Filled 2022-11-26: qty 200

## 2022-11-26 MED ORDER — POTASSIUM CHLORIDE 20 MEQ PO PACK
40.0000 meq | PACK | Freq: Once | ORAL | Status: AC
  Administered 2022-11-26: 40 meq via ORAL
  Filled 2022-11-26: qty 2

## 2022-11-26 MED ORDER — SODIUM CHLORIDE 0.9 % IV SOLN: 2.0000 g | Freq: Once | INTRAVENOUS | Status: DC

## 2022-11-26 MED ORDER — SODIUM CHLORIDE 0.9 % IV SOLN: INTRAVENOUS | Status: DC

## 2022-11-26 MED ORDER — ENOXAPARIN SODIUM 40 MG/0.4ML IJ SOSY
40.0000 mg | PREFILLED_SYRINGE | INTRAMUSCULAR | Status: DC
  Administered 2022-11-26 – 2022-11-29 (×4): 40 mg via SUBCUTANEOUS
  Filled 2022-11-26 (×4): qty 0.4

## 2022-11-26 MED ORDER — VANCOMYCIN HCL 500 MG/100ML IV SOLN
500.0000 mg | Freq: Once | INTRAVENOUS | Status: AC
  Administered 2022-11-26: 500 mg via INTRAVENOUS
  Filled 2022-11-26: qty 100

## 2022-11-26 MED ORDER — MORPHINE SULFATE (PF) 2 MG/ML IV SOLN
2.0000 mg | INTRAVENOUS | Status: DC | PRN
  Administered 2022-11-26 – 2022-11-29 (×6): 2 mg via INTRAVENOUS
  Filled 2022-11-26 (×6): qty 1

## 2022-11-26 MED ORDER — SODIUM CHLORIDE 0.9 % IV SOLN
2.0000 g | INTRAVENOUS | Status: DC
  Administered 2022-11-26 – 2022-11-30 (×5): 2 g via INTRAVENOUS
  Filled 2022-11-26 (×5): qty 20

## 2022-11-26 MED ORDER — VANCOMYCIN HCL 750 MG/150ML IV SOLN
750.0000 mg | Freq: Three times a day (TID) | INTRAVENOUS | Status: DC
  Administered 2022-11-26 – 2022-11-30 (×11): 750 mg via INTRAVENOUS
  Filled 2022-11-26 (×13): qty 150

## 2022-11-26 MED ORDER — ACETAMINOPHEN 325 MG PO TABS
650.0000 mg | ORAL_TABLET | Freq: Four times a day (QID) | ORAL | Status: DC | PRN
  Administered 2022-11-30: 650 mg via ORAL
  Filled 2022-11-26: qty 2

## 2022-11-26 NOTE — Assessment & Plan Note (Signed)
+   LUE redness and swelling s/p IV methamphetamine injection 8/26  LUE u/s with Ill-defined fluid collection in the medial left forearm with surrounding soft tissue edema, suspicious for phlegmon or hematoma Will place on IV rocephin and vancomycin for infectious coverage  Blood cultures  Pending formal surgery consult to better assess phlegmon  Follow closely

## 2022-11-26 NOTE — ED Provider Notes (Signed)
[facility] Provider Note    Event Date/Time   First MD Initiated Contact with Patient 11/26/22 [phone]     (approximate)   History   Cellulitis   HPI  Breanna Henry is a 42 y.o. female with a history of substance abuse who presents with complaints of redness and pain to her left forearm.  She reports redness started at site of an injection of drugs earlier in the week.  She reports redness has spread and she has significant pain.  No fevers reported     Physical Exam   Triage Vital Signs: ED Triage Vitals  Encounter Vitals Group     BP 11/26/22 0933 105/65     Systolic BP Percentile --      Diastolic BP Percentile --      Pulse Rate 11/26/22 0933 95     Resp 11/26/22 0933 17     Temp 11/26/22 0933 97.8 F (36.6 C)     Temp Source 11/26/22 0933 Oral     SpO2 11/26/22 0933 99 %     Weight 11/26/22 0934 81.6 kg (180 lb)     Height 11/26/22 0934 1.676 m (5\' 6" )     Head Circumference --      Peak Flow --      Pain Score 11/26/22 0934 8     Pain Loc --      Pain Education --      Exclude from Growth Chart --     Most recent vital signs: Vitals:   11/26/22 0933  BP: 105/65  Pulse: 95  Resp: 17  Temp: 97.8 F (36.6 C)  SpO2: 99%     General: Awake, no distress.  CV:  Good peripheral perfusion.  Resp:  Normal effort.  Abd:  No distention.  Other:  Left forearm: Erythema extending on the dorsal aspect, area of induration and possible fluctuance more proximally   ED Results / Procedures / Treatments   Labs (all labs ordered are listed, but only abnormal results are displayed) Labs Reviewed  BASIC METABOLIC PANEL - Abnormal; Notable for the following components:      Result Value   Sodium 134 (*)    Potassium 3.1 (*)    Glucose, Bld 111 (*)    Calcium 8.5 (*)    All other components within normal limits  CBC     EKG     RADIOLOGY Ultrasound forearm to evaluate for abscess    PROCEDURES:  Critical Care performed:    Procedures   MEDICATIONS ORDERED IN ED: Medications  vancomycin (VANCOCIN) IVPB 1000 mg/200 mL premix (has no administration in time range)     IMPRESSION / MDM / ASSESSMENT AND PLAN / ED COURSE  I reviewed the triage vital signs and the nursing notes. Patient's presentation is most consistent with acute presentation with potential threat to life or bodily function.  Patient presents with evidence of infection.  Differential includes cellulitis versus abscess in the forearm.  Doubt sepsis  Will need ultrasound to evaluate for abscess, will check labs.  Ultrasound without evidence of drainable fluid collection, lab work reviewed and is reassuring.  IV vancomycin ordered, patient will need admission for IV antibiotics  Discussed with Dr. Alvester Morin of hospitalist team who will admit but is requesting surgery consultation.  I have consulted general surgery      FINAL CLINICAL IMPRESSION(S) / ED DIAGNOSES   Final diagnoses:  Cellulitis of left upper extremity     Rx /  DC Orders   ED Discharge Orders     None        Note:  This document was prepared using Dragon voice recognition software and may include unintentional dictation errors.   Jene Every, MD 11/26/22 1104

## 2022-11-26 NOTE — H&P (Signed)
History and Physical    Patient: Breanna Henry QMV:[id number] DOB: 09/04/1980 DOA: 11/26/2022 DOS: the patient was seen and examined on 11/26/2022 [facility]  Patient coming from: Home  Chief Complaint:  Chief Complaint  Patient presents with   Cellulitis   HPI: Breanna Henry is a 42 y.o. female with medical history significant of polysubstance abuse presenting w/ LUE cellulitis vs. Abscess, IV drug abuse.  Patient reports injecting large amount of IV methamphetamine into her left forearm on Monday prior to going into rehab program.  Has had worsening left upper extremity redness and swelling over the past 2 days.  Positive malaise.  No overt fevers.  No chest pain or shortness of breath.  Patient denies any other illicit drug use including cocaine, opioids.  Patient does report taking Suboxone daily.  1/2 pack/day smoker.  Denies any alcohol use.  No purulent drainage.  No reported distal hand numbness of affected limb. Presented to ER afebrile, hemodynamically stable.  Satting well on room air.  White count 9, hemoglobin 13, platelets 224.  Sodium 134, potassium 3.1, creatinine 0.7. LUE u/s: Ill-defined fluid collection in the medial left forearm with surrounding soft tissue edema, suspicious for phlegmon or hematoma.  Review of Systems: As mentioned in the history of present illness. All other systems reviewed and are negative. History reviewed. No pertinent past medical history. History reviewed. No pertinent surgical history. Social History:  reports that she has been smoking cigarettes. She has never used smokeless tobacco. She reports current drug use. Drugs: Marijuana and Cocaine. She reports that she does not drink alcohol.  No Known Allergies  History reviewed. No pertinent family history.  Prior to Admission medications   Not on File    Physical Exam: Vitals:   11/26/22 0933 11/26/22 0934  BP: 105/65   Pulse: 95   Resp: 17   Temp: 97.8 F (36.6  C)   TempSrc: Oral   SpO2: 99%   Weight:  81.6 kg  Height:  5\' 6"  (1.676 m)   Physical Exam Constitutional:      Appearance: She is normal weight.  HENT:     Head: Normocephalic and atraumatic.     Mouth/Throat:     Mouth: Mucous membranes are moist.  Cardiovascular:     Rate and Rhythm: Normal rate and regular rhythm.  Pulmonary:     Effort: Pulmonary effort is normal.  Abdominal:     General: Bowel sounds are normal.  Musculoskeletal:        General: Normal range of motion.     Cervical back: Normal range of motion.  Skin:    General: Skin is warm.  Neurological:     General: No focal deficit present.     Data Reviewed:  There are no new results to review at this time. [place] LT UPPER EXTREM LTD SOFT TISSUE NON VASCULAR CLINICAL DATA:  Left forearm erythema, swelling and pain for 2 days. Question abscess related to IV drug use.  EXAM: ULTRASOUND LEFT UPPER EXTREMITY LIMITED  TECHNIQUE: Ultrasound examination of the upper extremity soft tissues was performed in the area of clinical concern.  COMPARISON:  None Available.  FINDINGS: Examination is targeted to the area of swelling medially in the left forearm. There is an ill-defined fluid collection in this area which measures approximately 1.2 x 1.7 x 0.9 cm. No well-defined or drainable fluid collection is identified at this time. There is generalized surrounding soft tissue edema without other focal fluid collection.  IMPRESSION: Ill-defined fluid collection in the medial left forearm with surrounding soft tissue edema, suspicious for phlegmon or hematoma. No well-defined or drainable fluid collection identified at this time.  Electronically Signed   By: Carey Bullocks M.D.   On: 11/26/2022 10:46  Lab Results  Component Value Date   WBC 9.1 11/26/2022   HGB 13.2 11/26/2022   HCT 39.6 11/26/2022   MCV 89.2 11/26/2022   PLT 224 11/26/2022   Last metabolic panel Lab Results  Component Value Date    GLUCOSE 111 (H) 11/26/2022   NA 134 (L) 11/26/2022   K 3.1 (L) 11/26/2022   CL 104 11/26/2022   CO2 25 11/26/2022   BUN 8 11/26/2022   CREATININE 0.69 11/26/2022   GFRNONAA >60 11/26/2022   CALCIUM 8.5 (L) 11/26/2022   PROT 5.9 (L) 03/01/2021   ALBUMIN 3.7 03/01/2021   BILITOT 0.5 03/01/2021   ALKPHOS 60 03/01/2021   AST 17 03/01/2021   ALT 11 03/01/2021   ANIONGAP 5 11/26/2022    Assessment and Plan: * Cellulitis and abscess of upper extremity + LUE redness and swelling s/p IV methamphetamine injection 8/26  LUE u/s with Ill-defined fluid collection in the medial left forearm with surrounding soft tissue edema, suspicious for phlegmon or hematoma Will place on IV rocephin and vancomycin for infectious coverage  Blood cultures  Pending formal surgery consult to better assess phlegmon  Follow closely  Polysubstance abuse (HCC) + hx/o polysubstance abuse including methamphetamines  UDS pending  Monitor for withdrawal sxs  Follow closely    Hypokalemia K 3.1  Replete  Check Mg level  Follow    Tobacco abuse counseling 1/2 PPD smoker  Nicotine patch    Greater than 50% was spent in counseling and coordination of care with patient Total encounter time 80 minutes or more     Advance Care Planning:   Code Status: Full Code   Consults: In conversation w/ Dr. Cyril Loosen, Surgery formally consulted for evaluation.   Family Communication: No family at the bedside   Severity of Illness: The appropriate patient status for this patient is INPATIENT. Inpatient status is judged to be reasonable and necessary in order to provide the required intensity of service to ensure the patient's safety. The patient's presenting symptoms, physical exam findings, and initial radiographic and laboratory data in the context of their chronic comorbidities is felt to place them at high risk for further clinical deterioration. Furthermore, it is not anticipated that the patient will be  medically stable for discharge from the hospital within 2 midnights of admission.   * I certify that at the point of admission it is my clinical judgment that the patient will require inpatient hospital care spanning beyond 2 midnights from the point of admission due to high intensity of service, high risk for further deterioration and high frequency of surveillance required.*  Author: Floydene Flock, MD 11/26/2022 11:38 AM  For on call review [email or web address].

## 2022-11-26 NOTE — Assessment & Plan Note (Signed)
+   hx/o polysubstance abuse including methamphetamines  UDS positive for amphetamine and cannabinoid Counseling was provided Monitor for withdrawal sxs  Follow closely

## 2022-11-26 NOTE — Progress Notes (Signed)
Pharmacy Antibiotic Note  Breanna Henry is a 42 y.o. female admitted on 11/26/2022 with cellulitis. Patient with PMH significant for IV drug use presents to ED with complaints of redness, swelling, and pain to left forearm. Ultrasound of left forearm shows no evidence of abscess. In ED, patient is afebrile with no leukocytosis. Pharmacy has been consulted for vancomycin dosing.  Plan: Give vancomycin 500 mg IV x1 (to complete a total load of 1500 mg IV in ED) Start vancomycin 750 mg IV every 8 hours (eAUC 511.6, Scr 0.8, Vd 0.72 L/kg) Continue ceftriaxone 2 g IV every 24 hours per MD Monitor renal function, clinical status, culture data, and LOT  Height: 5\' 6"  (167.6 cm) Weight: 81.6 kg (180 lb) IBW/kg (Calculated) : 59.3  Temp (24hrs), Avg:97.8 F (36.6 C), Min:97.8 F (36.6 C), Max:97.8 F (36.6 C)  Recent Labs  Lab 11/26/22 1001  WBC 9.1  CREATININE 0.69    Estimated Creatinine Clearance: 97.6 mL/min (by C-G formula based on SCr of 0.69 mg/dL).    No Known Allergies  Antimicrobials this admission: Vancomycin 8/28 >>  Ceftriaxone 8/28 >>   Dose adjustments this admission: N/A  Microbiology results: N/A  Thank you for involving pharmacy in this patient's care.   Rockwell Alexandria, PharmD Clinical Pharmacist 11/26/2022 11:30 AM

## 2022-11-26 NOTE — ED Notes (Signed)
See triage notes. Patient stated she relapsed and tried injecting meth into her left arm. The arm was noted to be very red from elbow down to about her wrist.

## 2022-11-26 NOTE — ED Triage Notes (Signed)
Pt here with reports that she is an IV drug user and since Monday has been having left forearm redness and swelling.

## 2022-11-26 NOTE — ED Notes (Signed)
Call to lab to add Mg

## 2022-11-26 NOTE — Assessment & Plan Note (Signed)
 1/2 PPD smoker  Nicotine patch

## 2022-11-26 NOTE — Progress Notes (Addendum)
PHARMACIST - PHYSICIAN COMMUNICATION  CONCERNING:  Suboxone  ASSESSMENT:  42 year old female admitted with cellulitis secondary to intravenous substance misuse. She endorses injecting methamphetamine earlier this week prior to entering a rehab program.  Medication reconciliation was performed and patient reports using Suboxone 8mg -2mg  , at a dose of two tablets per day. Last dose reportedly this morning prior to admission.  Pertinent findings:  This pharmacist reviewed pharmacy dispense records and found no recent fills of Suboxone. Additionally, the [place] Controlled Substances Reporting System was checked, looking specifically at dispense history for controlled substance prescriptions dating back to November 25, 2020. The last time Suboxone was dispensed to this patient was July 01, 2021.    CONCLUSION:  In summary, it is unlikely that patient takes Suboxone pursuant to a valid prescription. If she is receiving Suboxone directly from a provider, her active IVDU status is almost certainly in violation of any narcotics agreement or contract she would likely have had to sign, and the provider should be informed.   Will M. Dareen Piano, PharmD Clinical Pharmacist 11/26/2022 6:17 PM

## 2022-11-26 NOTE — Consult Note (Signed)
[facility] SURGICAL CONSULTATION NOTE (initial) - cpt: [phone]   HISTORY OF PRESENT ILLNESS (HPI):  42 y.o. female presented to [facility] ED today for evaluation of left forearm swelling and erythema. Patient reports she injected IV methamphetamine on Monday of this week into her left forearm. Since that time, she continues to have increasing erythema, swelling, and tenderness to the site. She denied fever, chills. No appreciable drainage. She reports a history of polysubstance abuse in the past. She is currently on Suboxone. She continues to smoke; 1/2 ppd. Work up in the ED revealed a normal WBC at 9.1K, Hgb to 13.2, sCr - 0.69, hypokalemia to 3.1. She did have Soft Tissue abscess which showed ill defined collection (1.2 x 1.7 x 0.9 cm); no definitive abscess. She was admitted to medicine service. Currently on Rocephin and Vancomycin.  Surgery is consulted by emergency medicine physician Dr. Jene Every, MD in this context for evaluation and management of left forearm cellulitis without abscess.  PAST MEDICAL HISTORY (PMH):  History reviewed. No pertinent past medical history.   PAST SURGICAL HISTORY (PSH):  History reviewed. No pertinent surgical history.   MEDICATIONS:  Prior to Admission medications   Not on File     ALLERGIES:  No Known Allergies   SOCIAL HISTORY:  Social History   Socioeconomic History   Marital status: Divorced    Spouse name: Not on file   Number of children: Not on file   Years of education: Not on file   Highest education level: Not on file  Occupational History   Not on file  Tobacco Use   Smoking status: Every Day    Current packs/day: 0.50    Types: Cigarettes   Smokeless tobacco: Never  Substance and Sexual Activity   Alcohol use: No   Drug use: Yes    Types: Marijuana, Cocaine    Comment: occasional   Sexual activity: Not on file  Other Topics Concern   Not on file  Social History Narrative   Not on file   Social  Determinants of Health   Financial Resource Strain: Not on file  Food Insecurity: Not on file  Transportation Needs: Not on file  Physical Activity: Not on file  Stress: Not on file  Social Connections: Not on file  Intimate Partner Violence: Not on file     FAMILY HISTORY:  History reviewed. No pertinent family history.    REVIEW OF SYSTEMS:  Review of Systems  Constitutional:  Negative for chills and fever.  Respiratory:  Negative for cough and shortness of breath.   Cardiovascular:  Negative for chest pain and palpitations.  Gastrointestinal:  Negative for nausea and vomiting.  Skin:  Negative for rash.       + Erythema (LUE)  All other systems reviewed and are negative.   VITAL SIGNS:  Temp:  [97.8 F (36.6 C)] 97.8 F (36.6 C) (08/28 0933) Pulse Rate:  [95] 95 (08/28 0933) Resp:  [17] 17 (08/28 0933) BP: (105)/(65) 105/65 (08/28 0933) SpO2:  [99 %] 99 % (08/28 0933) Weight:  [81.6 kg] 81.6 kg (08/28 0934)     Height: 5\' 6"  (167.6 cm) Weight: 81.6 kg BMI (Calculated): 29.07   INTAKE/OUTPUT:  No intake/output data recorded.  PHYSICAL EXAM:  Physical Exam Vitals and nursing note reviewed.  Constitutional:      General: She is not in acute distress.    Appearance: Normal appearance. She is not ill-appearing.  HENT:     Head: Normocephalic and atraumatic.  Eyes:     General: No scleral icterus.    Conjunctiva/sclera: Conjunctivae normal.  Pulmonary:     Effort: Pulmonary effort is normal. No respiratory distress.  Genitourinary:    Comments: Deferred Skin:    General: Skin is warm and dry.     Findings: Erythema present.          Comments: Significant erythema to the volar aspect of the left forearm from the elbow to just distal to the wrist, no fluctuance, no evidence of necrosis. Sensation intact distally, motor function intact, pulses 2+ distally as well. No evidence of compartment syndrome  Neurological:     General: No focal deficit present.      Mental Status: She is alert and oriented to person, place, and time.  Psychiatric:        Mood and Affect: Mood normal.        Behavior: Behavior normal.       Labs:     Latest Ref Rng & Units 11/26/2022   10:01 AM 03/01/2021    6:07 PM 03/01/2018   11:24 AM  CBC  WBC 4.0 - 10.5 K/uL 9.1  6.5  5.7   Hemoglobin 12.0 - 15.0 g/dL 09.8  11.9  14.7   Hematocrit 36.0 - 46.0 % 39.6  40.5  40.6   Platelets 150 - 400 K/uL 224  221  217       Latest Ref Rng & Units 11/26/2022   10:01 AM 03/01/2021    6:07 PM 03/01/2018   11:24 AM  CMP  Glucose 70 - 99 mg/dL 829  562  130   BUN 6 - 20 mg/dL 8  9  10    Creatinine 0.44 - 1.00 mg/dL 8.65  7.84  6.96   Sodium 135 - 145 mmol/L 134  137  139   Potassium 3.5 - 5.1 mmol/L 3.1  3.6  4.1   Chloride 98 - 111 mmol/L 104  104  108   CO2 22 - 32 mmol/L 25  29  26    Calcium 8.9 - 10.3 mg/dL 8.5  8.2  8.8   Total Protein 6.5 - 8.1 g/dL  5.9    Total Bilirubin 0.3 - 1.2 mg/dL  0.5    Alkaline Phos 38 - 126 U/L  60    AST 15 - 41 U/L  17    ALT 0 - 44 U/L  11      Imaging studies:   US Soft Tissue LUE (11/26/2022) personally reviewed without gross abscess, question phlegmon, and radiologist report reviewed below:  IMPRESSION: Ill-defined fluid collection in the medial left forearm with surrounding soft tissue edema, suspicious for phlegmon or hematoma. No well-defined or drainable fluid collection identified at this time.   Assessment/Plan: (ICD-10's: L03.114) 42 y.o. female with left forearm cellulitis without abscess secondary to IVDA   - No appreciable abscess on imaging nor examination; No role for I&D currently  - Continue IV Abx   - Consider warm compresses  - Pain control prn  - Replete K+; monitor  - Further management per primary service; we will follow    All of the above findings and recommendations were discussed with the patient, and all of patient's questions were answered to her expressed satisfaction.  Thank you for the  opportunity to participate in this patient's care.   -- Lynden Oxford, PA-C [facility] 11/26/2022, 11:04 AM M-F: 7am - 4pm

## 2022-11-26 NOTE — Assessment & Plan Note (Signed)
Resolved with repletion. -Continue to monitor and replete as needed

## 2022-11-27 DIAGNOSIS — F1910 Other psychoactive substance abuse, uncomplicated: Secondary | ICD-10-CM | POA: Diagnosis not present

## 2022-11-27 DIAGNOSIS — L03114 Cellulitis of left upper limb: Secondary | ICD-10-CM | POA: Diagnosis not present

## 2022-11-27 LAB — COMPREHENSIVE METABOLIC PANEL
ALT: 11 U/L (ref 0–44)
AST: 12 U/L — ABNORMAL LOW (ref 15–41)
Albumin: 3 g/dL — ABNORMAL LOW (ref 3.5–5.0)
Alkaline Phosphatase: 84 U/L (ref 38–126)
Anion gap: 5 (ref 5–15)
BUN: 10 mg/dL (ref 6–20)
CO2: 26 mmol/L (ref 22–32)
Calcium: 8.1 mg/dL — ABNORMAL LOW (ref 8.9–10.3)
Chloride: 106 mmol/L (ref 98–111)
Creatinine, Ser: 0.73 mg/dL (ref 0.44–1.00)
GFR, Estimated: >60 mL/min (ref >60)
Glucose, Bld: 96 mg/dL (ref 70–99)
Potassium: 4 mmol/L (ref 3.5–5.1)
Sodium: 137 mmol/L (ref 135–145)
Total Bilirubin: 0.4 mg/dL (ref 0.3–1.2)
Total Protein: 5.9 g/dL — ABNORMAL LOW (ref 6.5–8.1)

## 2022-11-27 LAB — CBC
HCT: 33.7 % — ABNORMAL LOW (ref 36.0–46.0)
Hemoglobin: 11.2 g/dL — ABNORMAL LOW (ref 12.0–15.0)
MCH: 29.2 pg (ref 26.0–34.0)
MCHC: 33.2 g/dL (ref 30.0–36.0)
MCV: 88 fL (ref 80.0–100.0)
Platelets: 224 10*3/uL (ref 150–400)
RBC: 3.83 MIL/uL — ABNORMAL LOW (ref 3.87–5.11)
RDW: 13.3 % (ref 11.5–15.5)
WBC: 6.2 10*3/uL (ref 4.0–10.5)
nRBC: 0 % (ref 0.0–0.2)

## 2022-11-27 NOTE — Progress Notes (Signed)
  Progress Note   Patient: Breanna Henry ZOX:[id number] DOB: 1980-09-21 DOA: 11/26/2022     1 DOS: the patient was seen and examined on 11/27/2022   Brief hospital course: Taken from H&P.  Breanna Henry is a 42 y.o. female with medical history significant of polysubstance abuse presenting w/ LUE cellulitis vs. Abscess, IV drug abuse.  Patient reports injecting large amount of IV methamphetamine into her left forearm on Monday prior to going into rehab program.   On presentation hemodynamically stable, no leukocytosis.  Left upper extremity with concern of cellulitis and abscess, ultrasound with Ill-defined fluid collection in the medial left forearm with surrounding soft tissue edema, suspicious for phlegmon or hematoma.   surgery was consulted but she does not has any drainable abscess.  Started on Rocephin and vancomycin.  Cultures were sent.  8/29: Clinically improving, preliminary blood cultures negative in 24-hour, mild hypokalemia resolved.  UDS positive for amphetamine and cannabinoid.  Surgery would like to continue IV antibiotics for another 24 hours and if remains stable can be discharged tomorrow.    Assessment and Plan: * Cellulitis and abscess of upper extremity + LUE redness and swelling s/p IV methamphetamine injection 8/26  LUE u/s with Ill-defined fluid collection in the medial left forearm with surrounding soft tissue edema, suspicious for phlegmon or hematoma General Surgery was consulted and there is no drainable abscess. Clinically improving with IV antibiotics. -Continue with current IV antibiotics for another 24-hour and then can be discharged on p.o. if continue to improve  Polysubstance abuse (HCC) + hx/o polysubstance abuse including methamphetamines  UDS positive for amphetamine and cannabinoid Counseling was provided Monitor for withdrawal sxs  Follow closely    Tobacco abuse counseling 1/2 PPD smoker  Nicotine patch    Hypokalemia Resolved with  repletion. -Continue to monitor and replete as needed   Subjective: Patient continued to have some left forearm pain, improving erythema.  Physical Exam: Vitals:   11/26/22 1529 11/26/22 1556 11/26/22 2324 11/27/22 0805  BP:  (!) 92/45 (!) 105/49 112/60  Pulse: 79 69 67 70  Resp: 18 15 20 14   Temp: 97.7 F (36.5 C) 98.3 F (36.8 C) 99.2 F (37.3 C) 98.1 F (36.7 C)  TempSrc: Oral Oral    SpO2:  100% 99% 100%  Weight:      Height:       General.  Well-developed lady, in no acute distress. Pulmonary.  Lungs clear bilaterally, normal respiratory effort. CV.  Regular rate and rhythm, no JVD, rub or murmur. Abdomen.  Soft, nontender, nondistended, BS positive. CNS.  Alert and oriented .  No focal neurologic deficit. Extremities.  No edema, no cyanosis, pulses intact and symmetrical.  Left forearm with mild erythema and some underlying induration, no fluctuation Psychiatry.  Judgment and insight appears normal.   Data Reviewed: Prior data reviewed  Family Communication: Discussed with patient  Disposition: Status is: Inpatient Remains inpatient appropriate because: For IV antibiotic  Planned Discharge Destination: Home  DVT prophylaxis.  Lovenox Time spent: 40 minutes  This record has been created using Conservation officer, historic buildings. Errors have been sought and corrected,but may not always be located. Such creation errors do not reflect on the standard of care.   Author: Arnetha Courser, MD 11/27/2022 2:47 PM  For on call review [email or web address].

## 2022-11-27 NOTE — Progress Notes (Signed)
[facility] SURGICAL PROGRESS NOTE (cpt [phone])  Hospital Day(s): 1.  Interval History: Patient seen and examined, no acute events or new complaints overnight. Patient reports she feels better; erythema is improving. She denies fever, chills. She remains without leukocytosis; 6.2K.Renal function normal; sCr - 0.73; UO - unmeasured. No electrolyte derangements. She continues on Rocephin and Vancomycin.   Review of Systems:  Constitutional: denies fever, chills  HEENT: denies cough or congestion  Respiratory: denies any shortness of breath  Cardiovascular: denies chest pain or palpitations  Musculoskeletal: + left forearm pain; denies decreased motor or sensation Integumentary: + left forearm erythema (improving)  Vital signs in last 24 hours: [min-max] current  Temp:  [97.7 F (36.5 C)-99.2 F (37.3 C)] 99.2 F (37.3 C) (08/28 2324) Pulse Rate:  [67-95] 67 (08/28 2324) Resp:  [14-20] 20 (08/28 2324) BP: (92-105)/(45-73) 105/49 (08/28 2324) SpO2:  [99 %-100 %] 99 % (08/28 2324) Weight:  [81.6 kg] 81.6 kg (08/28 0934)     Height: 5\' 6"  (167.6 cm) Weight: 81.6 kg BMI (Calculated): 29.07   Intake/Output last 2 shifts:  08/28 0701 - 08/29 0700 In: 3080.9 [P.O.:840; I.V.:1683.6; IV Piggyback:557.2] Out: -    Physical Exam:  Constitutional: alert, cooperative and no distress  HENT: normocephalic without obvious abnormality  Eyes: PERRL, EOM's grossly intact and symmetric  Respiratory: breathing non-labored at rest  Integumentary: Erythema to left volar forearm is markedly improved; still indurated, no fluctuance, no evidence of necrotizing infection.    Labs:     Latest Ref Rng & Units 11/27/2022    3:24 AM 11/26/2022   10:01 AM 03/01/2021    6:07 PM  CBC  WBC 4.0 - 10.5 K/uL 6.2  9.1  6.5   Hemoglobin 12.0 - 15.0 g/dL 01.0  27.2  53.6   Hematocrit 36.0 - 46.0 % 33.7  39.6  40.5   Platelets 150 - 400 K/uL 224  224  221       Latest Ref Rng & Units 11/27/2022     3:24 AM 11/26/2022   10:01 AM 03/01/2021    6:07 PM  CMP  Glucose 70 - 99 mg/dL 96  644  034   BUN 6 - 20 mg/dL 10  8  9    Creatinine 0.44 - 1.00 mg/dL 7.42  5.95  6.38   Sodium 135 - 145 mmol/L 137  134  137   Potassium 3.5 - 5.1 mmol/L 4.0  3.1  3.6   Chloride 98 - 111 mmol/L 106  104  104   CO2 22 - 32 mmol/L 26  25  29    Calcium 8.9 - 10.3 mg/dL 8.1  8.5  8.2   Total Protein 6.5 - 8.1 g/dL 5.9   5.9   Total Bilirubin 0.3 - 1.2 mg/dL 0.4   0.5   Alkaline Phos 38 - 126 U/L 84   60   AST 15 - 41 U/L 12   17   ALT 0 - 44 U/L 11   11      Imaging studies: No new pertinent imaging studies   Assessment/Plan: (ICD-10's: L03.114) 42 y.o. female with left forearm cellulitis without abscess secondary to IVDA               - No appreciable abscess on imaging nor examination; clinically improving. No role for I&D currently             - Continue IV Abx              -  Consider warm compresses             - Pain control prn             - Further management per primary service  - Discharge Planning: Clinically improving; no evidence of abscess. Recommend another 24 hours IV Abx. If continues to improve tomorrow, should be okay for discharge     All of the above findings and recommendations were discussed with the patient, and the medical team, and all of patient's questions were answered to her expressed satisfaction.   -- Lynden Oxford, PA-C [facility] 11/27/2022, 7:22 AM M-F: 7am - 4pm

## 2022-11-27 NOTE — Discharge Instructions (Signed)

## 2022-11-27 NOTE — TOC Progression Note (Signed)
Transition of Care [facility]) - Progression Note    Patient Details  Name: Breanna Henry MRN: [id number] Date of Birth: July 26, 1980  Transition of Care [facility]) CM/SW Contact  Marlowe Sax, RN Phone Number: 11/27/2022, 11:12 AM  Clinical Narrative:    Transition of Care [facility]) - Inpatient Brief Assessment   Patient Details  Name: Breanna Henry MRN: [id number] Date of Birth: November 27, 1980  Transition of Care [facility]) CM/SW Contact:    Marlowe Sax, RN Phone Number: 11/27/2022, 11:12 AM   Clinical Narrative: Goes to outpatient substance abuse program Transportation is not a problem has Medicaid transport Goes to [facility] Has 2 room mates No reported needs   Transition of Care Asessment: Insurance and Status: Insurance coverage has been reviewed Patient has primary care physician: Yes (Center, [facility]) Home environment has been reviewed: yes Prior level of function:: independent Prior/Current Home Services: No current home services Social Determinants of Health Reivew: SDOH reviewed interventions complete Readmission risk has been reviewed: Yes Transition of care needs: no transition of care needs at this time         Expected Discharge Plan and Services                                               Social Determinants of Health (SDOH) Interventions SDOH Screenings   Food Insecurity: Patient Declined (11/26/2022)  Housing: Patient Declined (11/26/2022)  Transportation Needs: Patient Declined (11/26/2022)  Utilities: Patient Declined (11/26/2022)  Tobacco Use: High Risk (11/26/2022)    Readmission Risk Interventions     No data to display

## 2022-11-27 NOTE — Hospital Course (Addendum)
Taken from H&P.  Breanna Henry is a 42 y.o. female with medical history significant of polysubstance abuse presenting w/ LUE cellulitis vs. Abscess, IV drug abuse.  Patient reports injecting large amount of IV methamphetamine into her left forearm on Monday prior to going into rehab program.   On presentation hemodynamically stable, no leukocytosis.  Left upper extremity with concern of cellulitis and abscess, ultrasound with Ill-defined fluid collection in the medial left forearm with surrounding soft tissue edema, suspicious for phlegmon or hematoma.   surgery was consulted but she does not has any drainable abscess.  Started on Rocephin and vancomycin.  Cultures were sent.  8/29: Clinically improving, preliminary blood cultures negative in 24-hour, mild hypokalemia resolved.  UDS positive for amphetamine and cannabinoid.  Surgery would like to continue IV antibiotics for another 24 hours and if remains stable can be discharged tomorrow.  9/1: Patient remained hemodynamically stable.  Electrolyte abnormalities were corrected.  Improving erythema, continue to have some underlying induration.  Repeat ultrasound with similar findings and no obvious abscess to drain.  Another discussion with general surgery and patient is being discharged on Bactrim for 6 more days.  She will follow-up with general surgery as outpatient for further recommendations.

## 2022-11-28 DIAGNOSIS — L03114 Cellulitis of left upper limb: Secondary | ICD-10-CM | POA: Diagnosis not present

## 2022-11-28 DIAGNOSIS — F1910 Other psychoactive substance abuse, uncomplicated: Secondary | ICD-10-CM | POA: Diagnosis not present

## 2022-11-28 DIAGNOSIS — L03119 Cellulitis of unspecified part of limb: Secondary | ICD-10-CM | POA: Diagnosis not present

## 2022-11-28 DIAGNOSIS — L02419 Cutaneous abscess of limb, unspecified: Secondary | ICD-10-CM | POA: Diagnosis not present

## 2022-11-28 LAB — CREATININE, SERUM
Creatinine, Ser: 0.58 mg/dL (ref 0.44–1.00)
GFR, Estimated: >60 mL/min (ref >60)

## 2022-11-28 NOTE — Progress Notes (Signed)
Pharmacy Antibiotic Note  Breanna Henry is a 42 y.o. female admitted on 11/26/2022 with cellulitis. Patient with PMH significant for IV drug use presents to ED with complaints of redness, swelling, and pain to left forearm. Ultrasound of left forearm shows no evidence of abscess. In ED, patient is afebrile with no leukocytosis. Pharmacy has been consulted for vancomycin dosing.  Plan: Continue Vancomycin 750 mg IV every 8 hours  eAUC 511.6 Scr 0.8(actual 0.58), Vd 0.72 L/kg  Continue ceftriaxone 2 g IV every 24 hours per MD   Monitor renal function, clinical status, culture data, and LOT  Height: 5\' 6"  (167.6 cm) Weight: 81.6 kg (180 lb) IBW/kg (Calculated) : 59.3  Temp (24hrs), Avg:98.2 F (36.8 C), Min:98.1 F (36.7 C), Max:98.3 F (36.8 C)  Recent Labs  Lab 11/26/22 1001 11/27/22 0324 11/28/22 0637  WBC 9.1 6.2  --   CREATININE 0.69 0.73 0.58    Estimated Creatinine Clearance: 97.6 mL/min (by C-G formula based on SCr of 0.58 mg/dL).    No Known Allergies  Antimicrobials this admission: Vancomycin 8/28 >>  Ceftriaxone 8/28 >>   Dose adjustments this admission: N/A  Microbiology results: Bcx: NGTD  Thank you for involving pharmacy in this patient's care.   Bettey Costa, PharmD Clinical Pharmacist 11/28/2022 12:34 PM

## 2022-11-28 NOTE — Progress Notes (Signed)
Progress Note   Patient: Breanna Henry FAO:[id number] DOB: Jul 14, 1980 DOA: 11/26/2022     2 DOS: the patient was seen and examined on 11/28/2022   Brief hospital course: Taken from H&P.  Breanna Henry is a 42 y.o. female with medical history significant of polysubstance abuse presenting w/ LUE cellulitis vs. Abscess, IV drug abuse.  Patient reports injecting large amount of IV methamphetamine into her left forearm on Monday prior to going into rehab program.   On presentation hemodynamically stable, no leukocytosis.  Left upper extremity with concern of cellulitis and abscess, ultrasound with Ill-defined fluid collection in the medial left forearm with surrounding soft tissue edema, suspicious for phlegmon or hematoma.   surgery was consulted but she does not has any drainable abscess.  Started on Rocephin and vancomycin.  Cultures were sent.  8/29: Clinically improving, preliminary blood cultures negative in 24-hour, mild hypokalemia resolved.  UDS positive for amphetamine and cannabinoid.  Surgery would like to continue IV antibiotics for another 24 hours and if remains stable can be discharged tomorrow.   11/28/22: The patient was seen and examined at bedside.  She is on prolonged IV antibiotics for left arm cellulitis without abscess secondary to IV drug abuse.  She has no new complaints at this time.  Assessment and Plan: * Cellulitis and abscess of upper extremity + LUE redness and swelling s/p IV methamphetamine injection 8/26  LUE u/s with Ill-defined fluid collection in the medial left forearm with surrounding soft tissue edema, suspicious for phlegmon or hematoma General Surgery was consulted and there is no drainable abscess. Clinically improving with IV antibiotics. -Continue with current IV antibiotics for another 24-hour and then can be discharged on p.o. if continue to improve  Polysubstance abuse (HCC) + hx/o polysubstance abuse including methamphetamines  UDS positive for  amphetamine and cannabinoid Counseling was provided Monitor for withdrawal sxs  Follow closely    Tobacco abuse counseling 1/2 PPD smoker  Nicotine patch    Hypokalemia Resolved with repletion. -Continue to monitor and replete as needed   Subjective: Patient continued to have some left forearm pain, improving erythema.  Physical Exam: Vitals:   11/27/22 0805 11/27/22 1522 11/28/22 0751 11/28/22 1543  BP: 112/60 (!) 104/47 (!) 108/57 109/60  Pulse: 70 73 75 70  Resp: 14 14 14 14   Temp: 98.1 F (36.7 C) 98.3 F (36.8 C) 98.1 F (36.7 C) 98.2 F (36.8 C)  TempSrc:      SpO2: 100% 98% 100% 96%  Weight:      Height:       General.  Well-developed lady, in no acute distress. Pulmonary.  Lungs clear bilaterally, normal respiratory effort. CV.  Regular rate and rhythm, no JVD, rub or murmur. Abdomen.  Soft, nontender, nondistended, BS positive. CNS.  Alert and oriented .  No focal neurologic deficit. Extremities.  No edema, no cyanosis, pulses intact and symmetrical.  Left forearm with mild erythema and some underlying induration, no fluctuation Psychiatry.  Judgment and insight appears normal.   Data Reviewed: Prior data reviewed  Family Communication: Discussed with patient  Disposition: Status is: Inpatient Remains inpatient appropriate because: For IV antibiotic  Planned Discharge Destination: Home  DVT prophylaxis.  Lovenox Time spent: 40 minutes  This record has been created using Conservation officer, historic buildings. Errors have been sought and corrected,but may not always be located. Such creation errors do not reflect on the standard of care.   Author: Darlin Drop, DO 11/28/2022 6:48 PM  For on call  review [email or web address].

## 2022-11-28 NOTE — Plan of Care (Signed)

## 2022-11-28 NOTE — Progress Notes (Signed)
[facility] SUR[facility]ICAL PROGRESS NOTE (cpt [phone])  Hospital Day(s): 2.  Interval History: Patient seen and examined, no acute events or new complaints overnight. Patient reports her left forearm pain is improving; still sore. Erythema improving as well. She denies fever, chills. No new labs this AM. She continues on Rocephin and Vancomycin.   Review of Systems:  Constitutional: denies fever, chills  HEENT: denies cough or congestion  Respiratory: denies any shortness of breath  Cardiovascular: denies chest pain or palpitations  Musculoskeletal: + left forearm pain; denies decreased motor or sensation Integumentary: + left forearm erythema (improving)  Vital signs in last 24 hours: [min-max] current  Temp:  [98.1 F (36.7 C)-98.3 F (36.8 C)] 98.3 F (36.8 C) (08/29 1522) Pulse Rate:  [70-73] 73 (08/29 1522) Resp:  [14] 14 (08/29 1522) BP: (104-112)/(47-60) 104/47 (08/29 1522) SpO2:  [98 %-100 %] 98 % (08/29 1522)     Height: 5\' 6"  (167.6 cm) Weight: 81.6 kg BMI (Calculated): 29.07   Intake/Output last 2 shifts:  08/29 0701 - 08/30 0700 In: 1256.7 [P.O.:240; I.V.:566.7; IV Piggyback:450] Out: -    Physical Exam:  Constitutional: alert, cooperative and no distress  HENT: normocephalic without obvious abnormality  Eyes: PERRL, EOM's grossly intact and symmetric  Respiratory: breathing non-labored at rest  Integumentary: Erythema to left volar forearm is markedly improved; still with localized indurated, no fluctuance, no evidence of necrotizing infection.    Labs:     Latest Ref Rng & Units 11/27/2022    3:24 AM 11/26/2022   10:01 AM 03/01/2021    6:07 PM  CBC  WBC 4.0 - 10.5 K/uL 6.2  9.1  6.5   Hemoglobin 12.0 - 15.0 g/dL 77.8  24.2  35.3   Hematocrit 36.0 - 46.0 % 33.7  39.6  40.5   Platelets 150 - 400 K/uL 224  224  221       Latest Ref Rng & Units 11/28/2022    6:37 AM 11/27/2022    3:24 AM 11/26/2022   10:01 AM  CMP  Glucose 70 - 99 mg/dL  96  614    BUN 6 - 20 mg/dL  10  8   Creatinine 4.31 - 1.00 mg/dL 5.40  0.86  7.61   Sodium 135 - 145 mmol/L  137  134   Potassium 3.5 - 5.1 mmol/L  4.0  3.1   Chloride 98 - 111 mmol/L  106  104   CO2 22 - 32 mmol/L  26  25   Calcium 8.9 - 10.3 mg/dL  8.1  8.5   Total Protein 6.5 - 8.1 g/dL  5.9    Total Bilirubin 0.3 - 1.2 mg/dL  0.4    Alkaline Phos 38 - 126 U/L  84    AST 15 - 41 U/L  12    ALT 0 - 44 U/L  11       Imaging studies: No new pertinent imaging studies   Assessment/Plan: (ICD-10's: L03.114) 42 y.o. female with left forearm cellulitis without abscess secondary to IVDA               - No appreciable abscess on examination; still with focal induration; clinically improving. No role for I&D currently             - Continue IV Abx; Narrow for home              - Pain control prn             -  Further management per primary service  - Discharge Planning: Nothing further from surgical perspective, no role for I&D. Recommend PO Abx for home. Stable for discharge from surgical perspective.   All of the above findings and recommendations were discussed with the patient, and the medical team, and all of patient's questions were answered to her expressed satisfaction.   -- Lynden Oxford, PA-C [facility] 11/28/2022, 7:16 AM M-F: 7am - 4pm

## 2022-11-29 DIAGNOSIS — L03119 Cellulitis of unspecified part of limb: Secondary | ICD-10-CM | POA: Diagnosis not present

## 2022-11-29 DIAGNOSIS — E663 Overweight: Secondary | ICD-10-CM | POA: Insufficient documentation

## 2022-11-29 DIAGNOSIS — F1910 Other psychoactive substance abuse, uncomplicated: Secondary | ICD-10-CM | POA: Diagnosis not present

## 2022-11-29 DIAGNOSIS — L02419 Cutaneous abscess of limb, unspecified: Secondary | ICD-10-CM | POA: Diagnosis not present

## 2022-11-29 MED ORDER — OXYCODONE-ACETAMINOPHEN 5-325 MG PO TABS
1.0000 | ORAL_TABLET | ORAL | Status: DC | PRN
  Administered 2022-11-29 – 2022-11-30 (×5): 1 via ORAL
  Filled 2022-11-29 (×5): qty 1

## 2022-11-29 MED ORDER — SENNOSIDES-DOCUSATE SODIUM 8.6-50 MG PO TABS
2.0000 | ORAL_TABLET | Freq: Two times a day (BID) | ORAL | Status: DC
  Administered 2022-11-29 – 2022-11-30 (×3): 2 via ORAL
  Filled 2022-11-29 (×3): qty 2

## 2022-11-29 NOTE — Progress Notes (Signed)
  Progress Note   Patient: Breanna Henry UEA:[id number] DOB: 1981/03/17 DOA: 11/26/2022     3 DOS: the patient was seen and examined on 11/29/2022   Brief hospital course: Breanna Henry is a 42 y.o. female with medical history significant of polysubstance abuse presenting w/ LUE cellulitis vs. Abscess, IV drug abuse.  Patient reports injecting large amount of IV methamphetamine into her left forearm on Monday prior to going into rehab program.   On presentation hemodynamically stable, no leukocytosis.  Left upper extremity with concern of cellulitis and abscess, ultrasound with Ill-defined fluid collection in the medial left forearm with surrounding soft tissue edema, suspicious for phlegmon or hematoma.   surgery was consulted but she does not has any drainable abscess.  Started on Rocephin and vancomycin.  Cultures were sent.  8/29: Clinically improving, preliminary blood cultures negative in 24-hour, mild hypokalemia resolved.  UDS positive for amphetamine and cannabinoid.    8/31.  Left arm cellulitis appears to be more localized, with significant induration.  Will continue another day of IV antibiotics, consider imaging study for abscess tomorrow  Principal Problem:   Cellulitis and abscess of upper extremity Active Problems:   Polysubstance abuse (HCC)   Tobacco abuse counseling   Hypokalemia   Cellulitis of left upper extremity   Overweight (BMI 25.0-29.9)   Assessment and Plan: * Cellulitis and abscess of upper extremity secondary to IV drug use. Blood cultures so far has no growth.  Right arm cellulitis appears to be more localized, significant induration.  Patient will benefit for another day of IV antibiotics with Rocephin and vancomycin.  May consider imaging study tomorrow to identify any drainable abscess.   Polysubstance abuse (HCC) + hx/o polysubstance abuse including methamphetamines  UDS positive for amphetamine and cannabinoid Advised to quit.    Tobacco abuse  counseling 1/2 PPD smoker  Nicotine patch    Hypokalemia Resolved.       Subjective:  Patient still has some arm pain, otherwise doing better.  Physical Exam: Vitals:   11/28/22 0751 11/28/22 1543 11/28/22 2340 11/29/22 0750  BP: (!) 108/57 109/60 (!) 98/54 (!) 118/52  Pulse: 75 70 67 65  Resp: 14 14 18 14   Temp: 98.1 F (36.7 C) 98.2 F (36.8 C) 97.8 F (36.6 C) 98.4 F (36.9 C)  TempSrc:      SpO2: 100% 96% 98% 100%  Weight:      Height:       General exam: Appears calm and comfortable  Respiratory system: Clear to auscultation. Respiratory effort normal. Cardiovascular system: S1 & S2 heard, RRR. No JVD, murmurs, rubs, gallops or clicks. No pedal edema. Gastrointestinal system: Abdomen is nondistended, soft and nontender. No organomegaly or masses felt. Normal bowel sounds heard. Central nervous system: Alert and oriented. No focal neurological deficits. Extremities: Left arm small area of redness and tender.  But significant induration. Skin: No rashes, lesions or ulcers Psychiatry: Judgement and insight appear normal. Mood & affect appropriate.    Data Reviewed:  Lab results reviewed.  Family Communication: None  Disposition: Status is: Inpatient Remains inpatient appropriate because: Severity of disease, IV treatment.     Time spent: 35 minutes  Author: Marrion Coy, MD 11/29/2022 12:21 PM  For on call review [email or web address].

## 2022-11-29 NOTE — Plan of Care (Signed)

## 2022-11-30 ENCOUNTER — Inpatient Hospital Stay: Payer: MEDICAID

## 2022-11-30 DIAGNOSIS — Z716 Tobacco abuse counseling: Secondary | ICD-10-CM | POA: Diagnosis not present

## 2022-11-30 DIAGNOSIS — L02414 Cutaneous abscess of left upper limb: Secondary | ICD-10-CM

## 2022-11-30 DIAGNOSIS — E663 Overweight: Secondary | ICD-10-CM

## 2022-11-30 DIAGNOSIS — L03119 Cellulitis of unspecified part of limb: Secondary | ICD-10-CM | POA: Diagnosis not present

## 2022-11-30 DIAGNOSIS — E876 Hypokalemia: Secondary | ICD-10-CM | POA: Diagnosis not present

## 2022-11-30 DIAGNOSIS — F1910 Other psychoactive substance abuse, uncomplicated: Secondary | ICD-10-CM | POA: Diagnosis not present

## 2022-11-30 DIAGNOSIS — L03114 Cellulitis of left upper limb: Secondary | ICD-10-CM | POA: Diagnosis not present

## 2022-11-30 MED ORDER — SULFAMETHOXAZOLE-TRIMETHOPRIM 800-160 MG PO TABS: 1.0000 | ORAL_TABLET | Freq: Two times a day (BID) | ORAL | 0 refills | Status: AC

## 2022-11-30 MED ORDER — SULFAMETHOXAZOLE-TRIMETHOPRIM 800-160 MG PO TABS
1.0000 | ORAL_TABLET | Freq: Two times a day (BID) | ORAL | Status: DC
  Administered 2022-11-30: 1 via ORAL
  Filled 2022-11-30: qty 1

## 2022-11-30 NOTE — Progress Notes (Addendum)
11/30/2022  Subjective: No acute events.  Patient had repeat ultrasound today showing stable size in fluid collection with surrounding induration.  She reports the pain has been improving, although it is sensitive to touch still.  No drainage.  Vital signs: Temp:  [98.3 F (36.8 C)-98.7 F (37.1 C)] 98.7 F (37.1 C) (09/01 0007) Pulse Rate:  [65-67] 67 (09/01 0007) Resp:  [14-18] 18 (09/01 0007) BP: (109)/(57-68) 109/57 (09/01 0007) SpO2:  [98 %] 98 % (09/01 0007)   Intake/Output: 08/31 0701 - 09/01 0700 In: 930 [P.O.:680; IV Piggyback:250] Out: -  Last BM Date : 11/24/22  Physical Exam: Constitutional:  No acute distress Skin:  Proximal left forearm has an area of about 2.5 cm with residual erythema and induration.  No fluctuance, but remains with some tenderness.  No open wound.  Labs:  No results for input(s): "WBC", "HGB", "HCT", "PLT" in the last 72 hours. Recent Labs    11/28/22 0637  CREATININE 0.58   No results for input(s): "LABPROT", "INR" in the last 72 hours.  Imaging: [place] LT UPPER EXTREM LTD SOFT TISSUE NON VASCULAR  Result Date: 11/30/2022 CLINICAL DATA:  Persistent forearm swelling and erythema with increased pain. Follow-up abscess. History of IV drug use. EXAM: ULTRASOUND LEFT UPPER EXTREMITY LIMITED TECHNIQUE: Ultrasound examination of the upper extremity soft tissues was performed in the area of clinical concern. COMPARISON:  Previous study 11/26/2022 FINDINGS: Examination targeted to the anterior aspect of the proximal left forearm again demonstrates a complex fluid collection in the soft tissues which measures approximately 1.7 x 1.1 x 0.8 cm. This collection is similar in size to the previous study, and no well-defined fluid collection is identified. There is overlying skin thickening as well as surrounding hypervascularity which may be mildly increased from the prior study. No deep fluid collection identified. IMPRESSION: Persistent complex fluid collection in  the anterior proximal left forearm, with overlying skin thickening and hypervascularity which may be mildly increased from the prior study, most consistent with a phlegmon or ill-defined abscess. The collection has not significantly changed in size from the prior study of 4 days ago, and no well-defined collections are identified. This could be further evaluated with CT if clinically warranted. Electronically Signed   By: Carey Bullocks M.D.   On: 11/30/2022 13:11    Assessment/Plan: This is a 42 y.o. female with left forearm abscess  --Discussed with the patient the ultrasound results.  Overall the fluid collection is stable.  It is a bit deep and on exam it's only induration that is palpable, no fluctuance.  I think it's ok to continue antibiotics and forgo I&D at this point.  Recommended that she try warm compresses to help promote drainage.  Continue with po abx for total course of 10 days.  Will set up follow up in office. --OK for discharge home today from surgical standpoint.   I spent 25 minutes dedicated to the care of this patient on the date of this encounter to include pre-visit review of records, face-to-face time with the patient discussing diagnosis and management, and any post-visit coordination of care.  Howie Ill, MD [facility]

## 2022-11-30 NOTE — Plan of Care (Signed)

## 2022-11-30 NOTE — Discharge Summary (Signed)
Physician Discharge Summary   Patient: Breanna Henry MRN: [id number] DOB: [birth year]  Admit date:     11/26/2022  Discharge date: 11/30/22  Discharge Physician: Arnetha Courser   PCP: [facility]   Recommendations at discharge:  He is obtain CBC and BMP in 1 week Follow-up with general surgery Follow-up with primary care provider  Discharge Diagnoses: Principal Problem:   Cellulitis and abscess of upper extremity Active Problems:   Polysubstance abuse (HCC)   Tobacco abuse counseling   Hypokalemia   Cellulitis of left upper extremity   Overweight (BMI 25.0-29.9)   Hospital Course: Taken from H&P.  Breanna Henry is a 42 y.o. female with medical history significant of polysubstance abuse presenting w/ LUE cellulitis vs. Abscess, IV drug abuse.  Patient reports injecting large amount of IV methamphetamine into her left forearm on Monday prior to going into rehab program.   On presentation hemodynamically stable, no leukocytosis.  Left upper extremity with concern of cellulitis and abscess, ultrasound with Ill-defined fluid collection in the medial left forearm with surrounding soft tissue edema, suspicious for phlegmon or hematoma.   surgery was consulted but she does not has any drainable abscess.  Started on Rocephin and vancomycin.  Cultures were sent.  8/29: Clinically improving, preliminary blood cultures negative in 24-hour, mild hypokalemia resolved.  UDS positive for amphetamine and cannabinoid.  Surgery would like to continue IV antibiotics for another 24 hours and if remains stable can be discharged tomorrow.  9/1: Patient remained hemodynamically stable.  Electrolyte abnormalities were corrected.  Improving erythema, continue to have some underlying induration.  Repeat ultrasound with similar findings and no obvious abscess to drain.  Another discussion with general surgery and patient is being discharged on Bactrim for 6 more days.  She will follow-up  with general surgery as outpatient for further recommendations.  Assessment and Plan: * Cellulitis and abscess of upper extremity + LUE redness and swelling s/p IV methamphetamine injection 8/26  LUE u/s with Ill-defined fluid collection in the medial left forearm with surrounding soft tissue edema, suspicious for phlegmon or hematoma General Surgery was consulted and there is no drainable abscess. Clinically improving with IV antibiotics. -Continue with current IV antibiotics for another 24-hour and then can be discharged on p.o. if continue to improve  Polysubstance abuse (HCC) + hx/o polysubstance abuse including methamphetamines  UDS positive for amphetamine and cannabinoid Counseling was provided Monitor for withdrawal sxs  Follow closely    Tobacco abuse counseling 1/2 PPD smoker  Nicotine patch    Hypokalemia Resolved with repletion. -Continue to monitor and replete as needed   Consultants: General surgery Procedures performed: None Disposition: Home Diet recommendation:  Discharge Diet Orders (From admission, onward)     Start     Ordered   11/30/22 0000  Diet - low sodium heart healthy        11/30/22 1550           Regular diet DISCHARGE MEDICATION: Allergies as of 11/30/2022   No Known Allergies      Medication List     TAKE these medications    buprenorphine-naloxone 8-2 mg Subl SL tablet Commonly known as: SUBOXONE Place 2 tablets under the tongue daily.   sulfamethoxazole-trimethoprim 800-160 MG tablet Commonly known as: BACTRIM DS Take 1 tablet by mouth every 12 (twelve) hours for 6 days.        Follow-up Information     Donovan Kail, PA-C Follow up in 1 week(s).   Specialty:  Physician Assistant Contact information: [address] [place] [address] [phone]         [facility] Schedule an appointment as soon as possible for a visit in 1 week(s).   Specialty: General  Practice Contact information: [address] [place] [address] [phone]                Discharge Exam: Ceasar Mons Weights   11/26/22 0934  Weight: 81.6 kg   General.  Overweight lady, in no acute distress. Pulmonary.  Lungs clear bilaterally, normal respiratory effort. CV.  Regular rate and rhythm, no JVD, rub or murmur. Abdomen.  Soft, nontender, nondistended, BS positive. CNS.  Alert and oriented .  No focal neurologic deficit. Extremities.  No edema, no cyanosis, pulses intact and symmetrical. Psychiatry.  Judgment and insight appears normal.   Condition at discharge: stable  The results of significant diagnostics from this hospitalization (including imaging, microbiology, ancillary and laboratory) are listed below for reference.   Imaging Studies: [place] LT UPPER EXTREM LTD SOFT TISSUE NON VASCULAR  Result Date: 11/30/2022 CLINICAL DATA:  Persistent forearm swelling and erythema with increased pain. Follow-up abscess. History of IV drug use. EXAM: ULTRASOUND LEFT UPPER EXTREMITY LIMITED TECHNIQUE: Ultrasound examination of the upper extremity soft tissues was performed in the area of clinical concern. COMPARISON:  Previous study 11/26/2022 FINDINGS: Examination targeted to the anterior aspect of the proximal left forearm again demonstrates a complex fluid collection in the soft tissues which measures approximately 1.7 x 1.1 x 0.8 cm. This collection is similar in size to the previous study, and no well-defined fluid collection is identified. There is overlying skin thickening as well as surrounding hypervascularity which may be mildly increased from the prior study. No deep fluid collection identified. IMPRESSION: Persistent complex fluid collection in the anterior proximal left forearm, with overlying skin thickening and hypervascularity which may be mildly increased from the prior study, most consistent with a phlegmon or ill-defined abscess. The collection has not  significantly changed in size from the prior study of 4 days ago, and no well-defined collections are identified. This could be further evaluated with CT if clinically warranted. Electronically Signed   By: Carey Bullocks M.D.   On: 11/30/2022 13:11   [place] LT UPPER EXTREM LTD SOFT TISSUE NON VASCULAR  Result Date: 11/26/2022 CLINICAL DATA:  Left forearm erythema, swelling and pain for 2 days. Question abscess related to IV drug use. EXAM: ULTRASOUND LEFT UPPER EXTREMITY LIMITED TECHNIQUE: Ultrasound examination of the upper extremity soft tissues was performed in the area of clinical concern. COMPARISON:  None Available. FINDINGS: Examination is targeted to the area of swelling medially in the left forearm. There is an ill-defined fluid collection in this area which measures approximately 1.2 x 1.7 x 0.9 cm. No well-defined or drainable fluid collection is identified at this time. There is generalized surrounding soft tissue edema without other focal fluid collection. IMPRESSION: Ill-defined fluid collection in the medial left forearm with surrounding soft tissue edema, suspicious for phlegmon or hematoma. No well-defined or drainable fluid collection identified at this time. Electronically Signed   By: Carey Bullocks M.D.   On: 11/26/2022 10:46    Microbiology: Results for orders placed or performed during the hospital encounter of 11/26/22  Culture, blood (Routine X 2) w Reflex to ID Panel     Status: None (Preliminary result)   Collection Time: 11/26/22 11:41 AM   Specimen: BLOOD  Result Value Ref Range Status  Specimen Description BLOOD RIGHT ANTECUBITAL  Final   Special Requests   Final    BOTTLES DRAWN AEROBIC AND ANAEROBIC Blood Culture adequate volume   Culture   Final    NO GROWTH 4 DAYS Performed at [facility], [address] [place], [place] [address]    Report Status PENDING  Incomplete  Culture, blood (Routine X 2) w Reflex to ID Panel     Status: None (Preliminary  result)   Collection Time: 11/26/22 11:52 AM   Specimen: BLOOD  Result Value Ref Range Status   Specimen Description BLOOD BLOOD RIGHT HAND  Final   Special Requests   Final    BOTTLES DRAWN AEROBIC AND ANAEROBIC Blood Culture adequate volume   Culture   Final    NO GROWTH 4 DAYS Performed at [facility], [address] [place], [place] [address]    Report Status PENDING  Incomplete    Labs: CBC: Recent Labs  Lab 11/26/22 1001 11/27/22 0324  WBC 9.1 6.2  HGB 13.2 11.2*  HCT 39.6 33.7*  MCV 89.2 88.0  PLT 224 224   Basic Metabolic Panel: Recent Labs  Lab 11/26/22 1001 11/27/22 0324 11/28/22 0637  NA 134* 137  --   K 3.1* 4.0  --   CL 104 106  --   CO2 25 26  --   GLUCOSE 111* 96  --   BUN 8 10  --   CREATININE 0.69 0.73 0.58  CALCIUM 8.5* 8.1*  --   MG 2.1  --   --    Liver Function Tests: Recent Labs  Lab 11/27/22 0324  AST 12*  ALT 11  ALKPHOS 84  BILITOT 0.4  PROT 5.9*  ALBUMIN 3.0*   CBG: No results for input(s): "GLUCAP" in the last 168 hours.  Discharge time spent: greater than 30 minutes.  This record has been created using Conservation officer, historic buildings. Errors have been sought and corrected,but may not always be located. Such creation errors do not reflect on the standard of care.   Signed: Arnetha Courser, MD [facility] 11/30/2022

## 2022-12-01 LAB — CULTURE, BLOOD (ROUTINE X 2)
Culture: NO GROWTH
Culture: NO GROWTH
Special Requests: ADEQUATE
Special Requests: ADEQUATE

## 2022-12-04 ENCOUNTER — Encounter: Payer: Self-pay | Admitting: Physician Assistant

## 2022-12-04 ENCOUNTER — Ambulatory Visit (INDEPENDENT_AMBULATORY_CARE_PROVIDER_SITE_OTHER): Payer: MEDICAID | Admitting: Physician Assistant

## 2022-12-04 VITALS — BP 101/68 | HR 97 | Temp 98.4°F | Ht 66.0 in | Wt 180.0 lb

## 2022-12-04 DIAGNOSIS — L03114 Cellulitis of left upper limb: Secondary | ICD-10-CM | POA: Diagnosis not present

## 2022-12-04 DIAGNOSIS — Z09 Encounter for follow-up examination after completed treatment for conditions other than malignant neoplasm: Secondary | ICD-10-CM | POA: Diagnosis not present

## 2022-12-04 DIAGNOSIS — L03119 Cellulitis of unspecified part of limb: Secondary | ICD-10-CM

## 2022-12-04 DIAGNOSIS — F1910 Other psychoactive substance abuse, uncomplicated: Secondary | ICD-10-CM | POA: Diagnosis not present

## 2022-12-04 NOTE — Patient Instructions (Signed)

## 2022-12-04 NOTE — Progress Notes (Signed)
[facility] NOTE  12/04/2022  History of Present Illness: Breanna Henry is a 42 y.o. female well known to our service following admission from 08/28 - 09/01 secondary to left forearm cellulitis from IVDA. She was discharged home on Bactrim. Since being home, she reports her arm continues to do better. She still has a small area of erythema but this is markedly improved. No fever, chills, Nothing is draining from this. No other complaints this afternoon.   Past Medical History: No past medical history on file.   Past Surgical History: No past surgical history on file.  Home Medications: Prior to Admission medications   Medication Sig Start Date End Date Taking? Authorizing Provider  buprenorphine-naloxone (SUBOXONE) 8-2 mg SUBL SL tablet Place 2 tablets under the tongue daily.    [provider]  sulfamethoxazole-trimethoprim (BACTRIM DS) 800-160 MG tablet Take 1 tablet by mouth every 12 (twelve) hours for 6 days. 11/30/22 12/06/22  Arnetha Courser, MD    Allergies: No Known Allergies  Review of Systems: Review of Systems  Constitutional:  Negative for chills and fever.  Respiratory:  Negative for cough and shortness of breath.   Cardiovascular:  Negative for chest pain and palpitations.  Gastrointestinal:  Negative for nausea and vomiting.  Skin:  Negative for itching and rash.       + Left Forearm Erythema (Improved)  All other systems reviewed and are negative.   Physical Exam There were no vitals taken for this visit.  Physical Exam Vitals and nursing note reviewed. Exam conducted with a chaperone present.  Constitutional:      General: She is not in acute distress.    Appearance: Normal appearance.  HENT:     Head: Normocephalic and atraumatic.  Eyes:     General: No scleral icterus.    Conjunctiva/sclera: Conjunctivae normal.     Comments: Wearing glasses   Pulmonary:     Effort: Pulmonary effort is normal. No respiratory  distress.  Genitourinary:    Comments: Deferred Skin:    General: Skin is warm and dry.     Findings: Erythema present. No abscess.     Comments: Small area of erythema to the proximal volar forearm, this is markedly improved, the area is indurated without appreciable fluctuance.  Neurological:     General: No focal deficit present.     Mental Status: She is alert and oriented to person, place, and time.  Psychiatric:        Mood and Affect: Mood normal.        Behavior: Behavior normal.     Labs/Imaging: No new pertinent imaging studies    Assessment and Plan: This is a 42 y.o. female with healing left forearm cellulitis without abscess secondary to IVDA   - No area of appreciable abscess; erythema/cellulitis improving  - No indication for I&D at this time  - Complete course of Abx as prescribed; do not stop early  - Pain control prn  - She can follow up on as needed basis; She understands to call with any questions.concerns   Face-to-face time spent with the patient and care providers was 20 minutes, with more than 50% of the time spent counseling, educating, and coordinating care of the patient.     Lynden Oxford, PA-C [facility] 12/04/2022, 3:27 PM M-F: 7am - 4pm

## 2023-01-06 ENCOUNTER — Ambulatory Visit: Payer: MEDICAID | Admitting: Cardiology
